# Patient Record
Sex: Female | Born: 1970 | Race: White | Hispanic: No | Marital: Married | Smoking: Current every day smoker
Health system: Southern US, Community
[De-identification: ages and names within clinical notes are randomized; demographics above are authoritative.]

## PROBLEM LIST (undated history)

## (undated) DIAGNOSIS — J45909 Unspecified asthma, uncomplicated: Secondary | ICD-10-CM

## (undated) DIAGNOSIS — N6001 Solitary cyst of right breast: Secondary | ICD-10-CM

## (undated) DIAGNOSIS — N3281 Overactive bladder: Secondary | ICD-10-CM

## (undated) DIAGNOSIS — Z975 Presence of (intrauterine) contraceptive device: Secondary | ICD-10-CM

## (undated) DIAGNOSIS — R062 Wheezing: Secondary | ICD-10-CM

## (undated) HISTORY — DX: Solitary cyst of right breast: N60.01

## (undated) HISTORY — DX: Wheezing: R06.2

## (undated) HISTORY — DX: Overactive bladder: N32.81

## (undated) HISTORY — DX: Presence of (intrauterine) contraceptive device: Z97.5

## (undated) HISTORY — DX: Unspecified asthma, uncomplicated: J45.909

---

## 2003-06-26 ENCOUNTER — Ambulatory Visit (HOSPITAL_COMMUNITY): Admission: RE | Admit: 2003-06-26 | Discharge: 2003-06-26 | Payer: Self-pay | Admitting: Family Medicine

## 2003-06-26 ENCOUNTER — Encounter: Payer: Self-pay | Admitting: Family Medicine

## 2005-08-01 ENCOUNTER — Ambulatory Visit (HOSPITAL_COMMUNITY): Admission: RE | Admit: 2005-08-01 | Discharge: 2005-08-01 | Payer: Self-pay | Admitting: Family Medicine

## 2008-06-12 ENCOUNTER — Other Ambulatory Visit: Admission: RE | Admit: 2008-06-12 | Discharge: 2008-06-12 | Payer: Self-pay | Admitting: Obstetrics & Gynecology

## 2009-12-15 ENCOUNTER — Other Ambulatory Visit: Admission: RE | Admit: 2009-12-15 | Discharge: 2009-12-15 | Payer: Self-pay | Admitting: Obstetrics and Gynecology

## 2009-12-25 ENCOUNTER — Ambulatory Visit (HOSPITAL_COMMUNITY): Admission: RE | Admit: 2009-12-25 | Discharge: 2009-12-25 | Payer: Self-pay | Admitting: Obstetrics & Gynecology

## 2010-12-30 ENCOUNTER — Other Ambulatory Visit (HOSPITAL_COMMUNITY): Payer: Self-pay | Admitting: Family Medicine

## 2010-12-30 DIAGNOSIS — Z139 Encounter for screening, unspecified: Secondary | ICD-10-CM

## 2011-01-04 ENCOUNTER — Ambulatory Visit (HOSPITAL_COMMUNITY)
Admission: RE | Admit: 2011-01-04 | Discharge: 2011-01-04 | Disposition: A | Discharge status: Home / Self Care | Payer: BC Managed Care – PPO | Source: Ambulatory Visit | Attending: Family Medicine | Admitting: Family Medicine

## 2011-01-04 DIAGNOSIS — Z139 Encounter for screening, unspecified: Secondary | ICD-10-CM

## 2011-01-04 DIAGNOSIS — Z1231 Encounter for screening mammogram for malignant neoplasm of breast: Secondary | ICD-10-CM | POA: Insufficient documentation

## 2011-12-13 ENCOUNTER — Other Ambulatory Visit (HOSPITAL_COMMUNITY)
Admission: RE | Admit: 2011-12-13 | Discharge: 2011-12-13 | Disposition: A | Discharge status: Home / Self Care | Payer: Managed Care, Other (non HMO) | Source: Ambulatory Visit | Attending: Obstetrics and Gynecology | Admitting: Obstetrics and Gynecology

## 2011-12-13 DIAGNOSIS — Z01419 Encounter for gynecological examination (general) (routine) without abnormal findings: Secondary | ICD-10-CM | POA: Insufficient documentation

## 2012-04-23 ENCOUNTER — Other Ambulatory Visit: Payer: Self-pay | Admitting: Adult Health

## 2012-04-23 ENCOUNTER — Ambulatory Visit (HOSPITAL_COMMUNITY)
Admission: RE | Admit: 2012-04-23 | Discharge: 2012-04-23 | Disposition: A | Discharge status: Home / Self Care | Payer: Managed Care, Other (non HMO) | Source: Ambulatory Visit | Attending: Adult Health | Admitting: Adult Health

## 2012-04-23 DIAGNOSIS — Z1231 Encounter for screening mammogram for malignant neoplasm of breast: Secondary | ICD-10-CM | POA: Insufficient documentation

## 2012-04-23 DIAGNOSIS — Z139 Encounter for screening, unspecified: Secondary | ICD-10-CM

## 2013-06-25 ENCOUNTER — Other Ambulatory Visit: Payer: Self-pay | Admitting: Adult Health

## 2013-06-25 DIAGNOSIS — Z139 Encounter for screening, unspecified: Secondary | ICD-10-CM

## 2013-06-27 ENCOUNTER — Ambulatory Visit (HOSPITAL_COMMUNITY)
Admission: RE | Admit: 2013-06-27 | Discharge: 2013-06-27 | Disposition: A | Discharge status: Home / Self Care | Payer: Managed Care, Other (non HMO) | Source: Ambulatory Visit | Attending: Adult Health | Admitting: Adult Health

## 2013-06-27 DIAGNOSIS — Z139 Encounter for screening, unspecified: Secondary | ICD-10-CM

## 2013-06-27 DIAGNOSIS — Z1231 Encounter for screening mammogram for malignant neoplasm of breast: Secondary | ICD-10-CM | POA: Insufficient documentation

## 2013-07-01 ENCOUNTER — Other Ambulatory Visit: Payer: Self-pay | Admitting: Adult Health

## 2013-07-01 ENCOUNTER — Telehealth: Payer: Self-pay | Admitting: Adult Health

## 2013-07-01 DIAGNOSIS — R928 Other abnormal and inconclusive findings on diagnostic imaging of breast: Secondary | ICD-10-CM

## 2013-07-01 NOTE — Telephone Encounter (Signed)
busy

## 2013-07-31 ENCOUNTER — Ambulatory Visit (HOSPITAL_COMMUNITY)
Admission: RE | Admit: 2013-07-31 | Discharge: 2013-07-31 | Disposition: A | Discharge status: Home / Self Care | Payer: Managed Care, Other (non HMO) | Source: Ambulatory Visit | Attending: Adult Health | Admitting: Adult Health

## 2013-07-31 DIAGNOSIS — R928 Other abnormal and inconclusive findings on diagnostic imaging of breast: Secondary | ICD-10-CM

## 2013-09-05 LAB — HM HIV SCREENING LAB: HM HIV Screening: NEGATIVE

## 2013-09-05 LAB — HM HEPATITIS C SCREENING LAB: HM Hepatitis Screen: NEGATIVE

## 2013-11-15 ENCOUNTER — Ambulatory Visit (INDEPENDENT_AMBULATORY_CARE_PROVIDER_SITE_OTHER): Payer: Managed Care, Other (non HMO) | Admitting: Adult Health

## 2013-11-15 ENCOUNTER — Encounter: Payer: Self-pay | Admitting: Adult Health

## 2013-11-15 ENCOUNTER — Ambulatory Visit (HOSPITAL_COMMUNITY)
Admission: RE | Admit: 2013-11-15 | Discharge: 2013-11-15 | Disposition: A | Discharge status: Home / Self Care | Payer: Managed Care, Other (non HMO) | Source: Ambulatory Visit | Attending: Adult Health | Admitting: Adult Health

## 2013-11-15 ENCOUNTER — Other Ambulatory Visit: Payer: Self-pay | Admitting: Adult Health

## 2013-11-15 ENCOUNTER — Telehealth: Payer: Self-pay | Admitting: Adult Health

## 2013-11-15 ENCOUNTER — Other Ambulatory Visit (HOSPITAL_COMMUNITY)
Admission: RE | Admit: 2013-11-15 | Discharge: 2013-11-15 | Disposition: A | Discharge status: Home / Self Care | Payer: Managed Care, Other (non HMO) | Source: Ambulatory Visit | Attending: Adult Health | Admitting: Adult Health

## 2013-11-15 ENCOUNTER — Encounter (INDEPENDENT_AMBULATORY_CARE_PROVIDER_SITE_OTHER): Payer: Self-pay

## 2013-11-15 VITALS — BP 120/70 | HR 72 | Ht 62.0 in | Wt 173.0 lb

## 2013-11-15 DIAGNOSIS — Z01419 Encounter for gynecological examination (general) (routine) without abnormal findings: Secondary | ICD-10-CM | POA: Insufficient documentation

## 2013-11-15 DIAGNOSIS — R062 Wheezing: Secondary | ICD-10-CM | POA: Insufficient documentation

## 2013-11-15 DIAGNOSIS — Z1151 Encounter for screening for human papillomavirus (HPV): Secondary | ICD-10-CM | POA: Insufficient documentation

## 2013-11-15 DIAGNOSIS — Z1212 Encounter for screening for malignant neoplasm of rectum: Secondary | ICD-10-CM

## 2013-11-15 HISTORY — DX: Wheezing: R06.2

## 2013-11-15 LAB — CBC
HCT: 43.1 % (ref 36.0–46.0)
HEMOGLOBIN: 15 g/dL (ref 12.0–15.0)
MCH: 30.3 pg (ref 26.0–34.0)
MCHC: 34.8 g/dL (ref 30.0–36.0)
MCV: 87.1 fL (ref 78.0–100.0)
PLATELETS: 280 10*3/uL (ref 150–400)
RBC: 4.95 MIL/uL (ref 3.87–5.11)
RDW: 13.8 % (ref 11.5–15.5)
WBC: 13 10*3/uL — ABNORMAL HIGH (ref 4.0–10.5)

## 2013-11-15 LAB — COMPREHENSIVE METABOLIC PANEL
ALBUMIN: 4.2 g/dL (ref 3.5–5.2)
ALT: 13 U/L (ref 0–35)
AST: 14 U/L (ref 0–37)
Alkaline Phosphatase: 48 U/L (ref 39–117)
BILIRUBIN TOTAL: 0.5 mg/dL (ref 0.2–1.2)
BUN: 9 mg/dL (ref 6–23)
CO2: 26 mEq/L (ref 19–32)
Calcium: 9.1 mg/dL (ref 8.4–10.5)
Chloride: 108 mEq/L (ref 96–112)
Creat: 0.65 mg/dL (ref 0.50–1.10)
GLUCOSE: 74 mg/dL (ref 70–99)
POTASSIUM: 4.9 meq/L (ref 3.5–5.3)
SODIUM: 138 meq/L (ref 135–145)
TOTAL PROTEIN: 6.6 g/dL (ref 6.0–8.3)

## 2013-11-15 LAB — HEMOCCULT GUIAC POC 1CARD (OFFICE): FECAL OCCULT BLD: NEGATIVE

## 2013-11-15 LAB — LIPID PANEL
CHOL/HDL RATIO: 3.7 ratio
CHOLESTEROL: 184 mg/dL (ref 0–200)
HDL: 50 mg/dL (ref >39)
LDL Cholesterol: 113 mg/dL — ABNORMAL HIGH (ref 0–99)
Triglycerides: 107 mg/dL (ref <150)
VLDL: 21 mg/dL (ref 0–40)

## 2013-11-15 NOTE — Progress Notes (Signed)
Patient ID: Bonnie Murphy, female   DOB: 12/03/1970, 43 y.o.   MRN: [id number] History of Present Illness: Bonnie Murphy is a 43 year old white female in for a pap and physical.  Current Medications, Allergies, Past Medical History, Past Surgical History, Family History and Social History were reviewed in [facility] electronic medical record.    Review of Systems: Patient denies any headaches, blurred vision, shortness of breath, chest pain, abdominal pain, problems with bowel movements, urination, or intercourse. No joint pain or mood swings.She has some wheezing at night and uses inhaler 2-3 x per week, and she smokes and is not ready to quit.Her dad died in November with lung cancer.   Physical Exam:BP 120/70  Pulse 72  Ht 5\' 2"  (1.575 m)  Wt 173 lb (78.472 kg)  BMI 31.63 kg/m2 General:  Well developed, well nourished, no acute distress Skin:  Warm and dry Neck:  Midline trachea, normal thyroid Lungs; Clear to auscultation bilaterally, except lower left has some wheezing Breast:  No dominant palpable mass, retraction, or nipple discharge Cardiovascular: Regular rate and rhythm Abdomen:  Soft, non tender, no hepatosplenomegaly Pelvic:  External genitalia is normal in appearance.  The vagina is normal in appearance.  The cervix is bulbous, +IUD string, pap with HPV performed.  Uterus is felt to be normal size, shape, and contour.  No adnexal masses or tenderness noted. Rectal: Good sphincter tone, no polyps, or hemorrhoids felt.  Hemoccult negative. Extremities:  No swelling or varicosities noted Psych:  No mood changes, alert and cooperative, seems happy  Impression: Yearly gyn exam Wheezing in smoker    Plan: Check CBC,CMP,TSH and lipids Get chest xray Return in June for removal and reinsertion of IUD Physical in 1 year Mammogram yearly

## 2013-11-15 NOTE — Patient Instructions (Signed)
Physical in 1 year Mammogram in 1 year Get chest xray Call for labs

## 2013-11-15 NOTE — Telephone Encounter (Signed)
Left message that chest xray was normal

## 2013-11-16 LAB — TSH: TSH: 1.295 u[IU]/mL (ref 0.350–4.500)

## 2013-11-18 ENCOUNTER — Telehealth: Payer: Self-pay | Admitting: Adult Health

## 2013-11-18 NOTE — Telephone Encounter (Signed)
Left message labs normal

## 2014-01-06 ENCOUNTER — Other Ambulatory Visit: Payer: Self-pay | Admitting: Adult Health

## 2014-01-06 DIAGNOSIS — N6009 Solitary cyst of unspecified breast: Secondary | ICD-10-CM

## 2014-01-06 DIAGNOSIS — Z09 Encounter for follow-up examination after completed treatment for conditions other than malignant neoplasm: Secondary | ICD-10-CM

## 2014-01-29 ENCOUNTER — Ambulatory Visit (HOSPITAL_COMMUNITY)
Admission: RE | Admit: 2014-01-29 | Discharge: 2014-01-29 | Disposition: A | Discharge status: Home / Self Care | Payer: Managed Care, Other (non HMO) | Source: Ambulatory Visit | Attending: Adult Health | Admitting: Adult Health

## 2014-01-29 ENCOUNTER — Other Ambulatory Visit: Payer: Self-pay | Admitting: Adult Health

## 2014-01-29 DIAGNOSIS — N6009 Solitary cyst of unspecified breast: Secondary | ICD-10-CM

## 2014-01-29 DIAGNOSIS — Z09 Encounter for follow-up examination after completed treatment for conditions other than malignant neoplasm: Secondary | ICD-10-CM

## 2014-02-14 ENCOUNTER — Ambulatory Visit: Payer: Managed Care, Other (non HMO) | Admitting: Adult Health

## 2014-02-18 ENCOUNTER — Encounter: Payer: Self-pay | Admitting: Advanced Practice Midwife

## 2014-02-18 ENCOUNTER — Ambulatory Visit (INDEPENDENT_AMBULATORY_CARE_PROVIDER_SITE_OTHER): Payer: Managed Care, Other (non HMO) | Admitting: Advanced Practice Midwife

## 2014-02-18 VITALS — BP 110/70 | Ht 62.0 in | Wt 169.0 lb

## 2014-02-18 DIAGNOSIS — Z30433 Encounter for removal and reinsertion of intrauterine contraceptive device: Secondary | ICD-10-CM

## 2014-02-18 DIAGNOSIS — Z3202 Encounter for pregnancy test, result negative: Secondary | ICD-10-CM

## 2014-02-18 DIAGNOSIS — Z30430 Encounter for insertion of intrauterine contraceptive device: Secondary | ICD-10-CM

## 2014-02-18 LAB — POCT URINE PREGNANCY: Preg Test, Ur: NEGATIVE

## 2014-02-18 NOTE — Progress Notes (Signed)
Bonnie Murphy    HPI:   43 y.o. year old Caucasian female   who presents for removal and replacement of a Mirena IUD.  It has been 5 years since her previous IUD placement.   Filed Vitals:   02/18/14 1520  BP: 110/70    The risks and benefits of the method and placement have been thouroughly reviewed with the patient and all questions were answered.  Specifically the patient is aware of failure rate of 09/998, expulsion of the IUD and of possible perforation.  The patient is aware of irregular bleeding due to the method and understands the incidence of irregular bleeding diminishes with time.  Time out was performed.  A Graves speculum was placed.  The cervix was prepped using Betadine. The strings were found to be  visible.   They were grasped and the Mirena was easily removed. The cervix was then grasped with a tenaculum and the uterus was sounded to 7 cm. The IUD was inserted to 7 cm.  It was pulled back 1 cm and the IUD was disengaged.  The strings were trimmed to 3 cm.  Sonogram was performed and the proper placement of the IUD was verified.  The patient was instructed on signs and symptoms of infection and to check for the strings after each menses or each month.  The patient is to refrain from intercourse for 3 days.

## 2014-03-27 ENCOUNTER — Ambulatory Visit: Payer: Managed Care, Other (non HMO) | Admitting: Advanced Practice Midwife

## 2014-07-07 ENCOUNTER — Encounter: Payer: Self-pay | Admitting: Advanced Practice Midwife

## 2014-07-15 ENCOUNTER — Other Ambulatory Visit: Payer: Self-pay | Admitting: Adult Health

## 2014-07-15 DIAGNOSIS — Z1231 Encounter for screening mammogram for malignant neoplasm of breast: Secondary | ICD-10-CM

## 2014-07-18 ENCOUNTER — Ambulatory Visit (HOSPITAL_COMMUNITY)
Admission: RE | Admit: 2014-07-18 | Discharge: 2014-07-18 | Disposition: A | Discharge status: Home / Self Care | Payer: Managed Care, Other (non HMO) | Source: Ambulatory Visit | Attending: Adult Health | Admitting: Adult Health

## 2014-07-18 DIAGNOSIS — Z1231 Encounter for screening mammogram for malignant neoplasm of breast: Secondary | ICD-10-CM

## 2014-11-14 ENCOUNTER — Encounter (INDEPENDENT_AMBULATORY_CARE_PROVIDER_SITE_OTHER): Payer: Self-pay

## 2014-11-14 ENCOUNTER — Telehealth: Payer: Self-pay | Admitting: Nurse Practitioner

## 2014-11-14 ENCOUNTER — Encounter: Payer: Self-pay | Admitting: Nurse Practitioner

## 2014-11-14 ENCOUNTER — Ambulatory Visit (INDEPENDENT_AMBULATORY_CARE_PROVIDER_SITE_OTHER): Payer: Managed Care, Other (non HMO) | Admitting: Nurse Practitioner

## 2014-11-14 VITALS — BP 120/82 | HR 60 | Temp 98.2°F | Resp 12 | Ht 63.0 in | Wt 172.8 lb

## 2014-11-14 DIAGNOSIS — Z720 Tobacco use: Secondary | ICD-10-CM

## 2014-11-14 DIAGNOSIS — Z418 Encounter for other procedures for purposes other than remedying health state: Secondary | ICD-10-CM

## 2014-11-14 DIAGNOSIS — Z299 Encounter for prophylactic measures, unspecified: Secondary | ICD-10-CM

## 2014-11-14 DIAGNOSIS — Z7689 Persons encountering health services in other specified circumstances: Secondary | ICD-10-CM

## 2014-11-14 DIAGNOSIS — Z23 Encounter for immunization: Secondary | ICD-10-CM

## 2014-11-14 DIAGNOSIS — F17200 Nicotine dependence, unspecified, uncomplicated: Secondary | ICD-10-CM

## 2014-11-14 DIAGNOSIS — Z7189 Other specified counseling: Secondary | ICD-10-CM

## 2014-11-14 NOTE — Patient Instructions (Signed)
Welcome to [facility]!    Smoking Cessation Quitting smoking is important to your health and has many advantages. However, it is not always easy to quit since nicotine is a very addictive drug. Oftentimes, people try 3 times or more before being able to quit. This document explains the best ways for you to prepare to quit smoking. Quitting takes hard work and a lot of effort, but you can do it. ADVANTAGES OF QUITTING SMOKING  You will live longer, feel better, and live better.  Your body will feel the impact of quitting smoking almost immediately.  Within 20 minutes, blood pressure decreases. Your pulse returns to its normal level.  After 8 hours, carbon monoxide levels in the blood return to normal. Your oxygen level increases.  After 24 hours, the chance of having a heart attack starts to decrease. Your breath, hair, and body stop smelling like smoke.  After 48 hours, damaged nerve endings begin to recover. Your sense of taste and smell improve.  After 72 hours, the body is virtually free of nicotine. Your bronchial tubes relax and breathing becomes easier.  After 2 to 12 weeks, lungs can hold more air. Exercise becomes easier and circulation improves.  The risk of having a heart attack, stroke, cancer, or lung disease is greatly reduced.  After 1 year, the risk of coronary heart disease is cut in half.  After 5 years, the risk of stroke falls to the same as a nonsmoker.  After 10 years, the risk of lung cancer is cut in half and the risk of other cancers decreases significantly.  After 15 years, the risk of coronary heart disease drops, usually to the level of a nonsmoker.  If you are pregnant, quitting smoking will improve your chances of having a healthy baby.  The people you live with, especially any children, will be healthier.  You will have extra money to spend on things other than cigarettes. QUESTIONS TO THINK ABOUT BEFORE ATTEMPTING TO QUIT You may want to talk about  your answers with your health care provider.  Why do you want to quit?  If you tried to quit in the past, what helped and what did not?  What will be the most difficult situations for you after you quit? How will you plan to handle them?  Who can help you through the tough times? Your family? Friends? A health care provider?  What pleasures do you get from smoking? What ways can you still get pleasure if you quit? Here are some questions to ask your health care provider:  How can you help me to be successful at quitting?  What medicine do you think would be best for me and how should I take it?  What should I do if I need more help?  What is smoking withdrawal like? How can I get information on withdrawal? GET READY  Set a quit date.  Change your environment by getting rid of all cigarettes, ashtrays, matches, and lighters in your home, car, or work. Do not let people smoke in your home.  Review your past attempts to quit. Think about what worked and what did not. GET SUPPORT AND ENCOURAGEMENT You have a better chance of being successful if you have help. You can get support in many ways.  Tell your family, friends, and coworkers that you are going to quit and need their support. Ask them not to smoke around you.  Get individual, group, or telephone counseling and support. Programs are available at  local hospitals and health centers. Call your local health department for information about programs in your area.  Spiritual beliefs and practices may help some smokers quit.  Download a "quit meter" on your computer to keep track of quit statistics, such as how long you have gone without smoking, cigarettes not smoked, and money saved.  Get a self-help book about quitting smoking and staying off tobacco. LEARN NEW SKILLS AND BEHAVIORS  Distract yourself from urges to smoke. Talk to someone, go for a walk, or occupy your time with a task.  Change your normal routine. Take a  different route to work. Drink tea instead of coffee. Eat breakfast in a different place.  Reduce your stress. Take a hot bath, exercise, or read a book.  Plan something enjoyable to do every day. Reward yourself for not smoking.  Explore interactive web-based programs that specialize in helping you quit. GET MEDICINE AND USE IT CORRECTLY Medicines can help you stop smoking and decrease the urge to smoke. Combining medicine with the above behavioral methods and support can greatly increase your chances of successfully quitting smoking.  Nicotine replacement therapy helps deliver nicotine to your body without the negative effects and risks of smoking. Nicotine replacement therapy includes nicotine gum, lozenges, inhalers, nasal sprays, and skin patches. Some may be available over-the-counter and others require a prescription.  Antidepressant medicine helps people abstain from smoking, but how this works is unknown. This medicine is available by prescription.  Nicotinic receptor partial agonist medicine simulates the effect of nicotine in your brain. This medicine is available by prescription. Ask your health care provider for advice about which medicines to use and how to use them based on your health history. Your health care provider will tell you what side effects to look out for if you choose to be on a medicine or therapy. Carefully read the information on the package. Do not use any other product containing nicotine while using a nicotine replacement product.  RELAPSE OR DIFFICULT SITUATIONS Most relapses occur within the first 3 months after quitting. Do not be discouraged if you start smoking again. Remember, most people try several times before finally quitting. You may have symptoms of withdrawal because your body is used to nicotine. You may crave cigarettes, be irritable, feel very hungry, cough often, get headaches, or have difficulty concentrating. The withdrawal symptoms are only  temporary. They are strongest when you first quit, but they will go away within 10-14 days. To reduce the chances of relapse, try to:  Avoid drinking alcohol. Drinking lowers your chances of successfully quitting.  Reduce the amount of caffeine you consume. Once you quit smoking, the amount of caffeine in your body increases and can give you symptoms, such as a rapid heartbeat, sweating, and anxiety.  Avoid smokers because they can make you want to smoke.  Do not let weight gain distract you. Many smokers will gain weight when they quit, usually less than 10 pounds. Eat a healthy diet and stay active. You can always lose the weight gained after you quit.  Find ways to improve your mood other than smoking. FOR MORE INFORMATION  www.smokefree.gov  Document Released: 08/16/2001 Document Revised: 01/06/2014 Document Reviewed: 12/01/2011 [facility] Patient Information 2015 [employer] [place]. This information is not intended to replace advice given to you by your health care provider. Make sure you discuss any questions you have with your health care provider.

## 2014-11-14 NOTE — Progress Notes (Signed)
Pre visit review using our clinic review tool, if applicable. No additional management support is needed unless otherwise documented below in the visit note. 

## 2014-11-14 NOTE — Progress Notes (Signed)
Subjective:    Patient ID: Bonnie Murphy, female    DOB: 1971-02-10, 44 y.o.   MRN: [id number]  HPI  Ms. Chambers is a 44 yo female establishing care today.   1) Health Maintenance-   Diet- Eats at home mostly   Exercise- Works 10 hours 4 days a week, active job  Immunizations-  Refuses flu, Today will get a tdap   Mammogram- 06/2014 normal   Pap- 11/2013 [facility] normal, going next Friday.   Eye Exam- Last year  Dental Exam- UTD   2) Chronic Problems-  Asthma- Mild persistent, worse around season changes, Zyrtec- as needed, inhaler helpful   Cyst of right breast- Ultrasound twice, benign   3) Acute Problems-  No refills needed or acute issues.   Tobacco Use- nicotine losanges- throat scratchy. Interested in quitting    Review of Systems  Constitutional: Negative for fever, chills, diaphoresis and fatigue.  HENT: Negative for tinnitus and trouble swallowing.   Eyes: Negative for visual disturbance.  Respiratory: Positive for wheezing. Negative for chest tightness and shortness of breath.        Intermittent- controlled with inhaler  Cardiovascular: Negative for chest pain, palpitations and leg swelling.  Gastrointestinal: Negative for nausea, vomiting, diarrhea and constipation.  Genitourinary: Negative for dysuria.  Musculoskeletal: Negative for back pain and neck pain.  Skin: Negative for rash.  Neurological: Negative for dizziness, weakness, numbness and headaches.  Psychiatric/Behavioral: Negative for suicidal ideas and sleep disturbance. The patient is not nervous/anxious.    Past Medical History  Diagnosis Date  . Asthma     seasonal  . Cyst of right breast   . Wheezing November 17, 2013    Smokes, dad died with lung cancer get chest xray    History   Social History  . Marital Status: Married    Spouse Name: N/A  . Number of Children: N/A  . Years of Education: N/A   Occupational History  . Not on file.   Social History Main Topics  . Smoking status:  Current Every Day Smoker -- 1.00 packs/day    Types: Cigarettes  . Smokeless tobacco: Never Used     Comment: e -cig  . Alcohol Use: 0.6 oz/week    1 Standard drinks or equivalent per week     Comment: socially  . Drug Use: No  . Sexual Activity:    Partners: Male    Birth Control/ Protection: IUD     Comment: Husband    Other Topics Concern  . Not on file   Social History Narrative   [employer] in [place], television/film set    Lives with husband and 1 son   1 son has 1 son of his own 8 months    1 dog lives outside   Enjoys [place], outside activities, gardening    Past Surgical History  Procedure Laterality Date  . Cesarean section      Family History  Problem Relation Age of Onset  . Diabetes Mother   . Cancer Father     lung  . Cirrhosis Father   . Cancer Paternal Aunt     lung  . Cancer Paternal Uncle     lung  . Cirrhosis Maternal Grandmother     No Known Allergies  Current Outpatient Prescriptions on File Prior to Visit  Medication Sig Dispense Refill  . ALBUTEROL IN Inhale 2 puffs into the lungs as needed.    Marland Kitchen levonorgestrel (MIRENA) 20 MCG/24HR IUD 1 each by Intrauterine route  once.     No current facility-administered medications on file prior to visit.      Objective:   Physical Exam  Constitutional: She is oriented to person, place, and time. She appears well-developed and well-nourished. No distress.  BP 120/82 mmHg  Pulse 60  Temp(Src) 98.2 F (36.8 C) (Oral)  Resp 12  Ht 5\' 3"  (1.6 m)  Wt 172 lb 12.8 oz (78.382 kg)  BMI 30.62 kg/m2  SpO2 95%   HENT:  Head: Normocephalic and atraumatic.  Right Ear: External ear normal.  Left Ear: External ear normal.  Eyes: Right eye exhibits no discharge. Left eye exhibits no discharge. No scleral icterus.  Neck: Normal range of motion. Neck supple. No thyromegaly present.  Cardiovascular: Normal rate, regular rhythm, normal heart sounds and intact distal pulses.  Exam reveals no gallop and no  friction rub.   No murmur heard. Pulmonary/Chest: Effort normal and breath sounds normal. No respiratory distress. She has no wheezes. She has no rales. She exhibits no tenderness.  Abdominal: Soft. Bowel sounds are normal. She exhibits no distension and no mass. There is no tenderness. There is no rebound and no guarding.  Musculoskeletal: Normal range of motion. She exhibits no edema or tenderness.  Lymphadenopathy:    She has no cervical adenopathy.  Neurological: She is alert and oriented to person, place, and time. No cranial nerve deficit. She exhibits normal muscle tone. Coordination normal.  Skin: Skin is warm and dry. No rash noted. She is not diaphoretic.  Psychiatric: She has a normal mood and affect. Her behavior is normal. Judgment and thought content normal.      Assessment & Plan:

## 2014-11-14 NOTE — Telephone Encounter (Signed)
emmi mailed  °

## 2014-11-17 DIAGNOSIS — Z7689 Persons encountering health services in other specified circumstances: Secondary | ICD-10-CM | POA: Insufficient documentation

## 2014-11-17 DIAGNOSIS — F17200 Nicotine dependence, unspecified, uncomplicated: Secondary | ICD-10-CM | POA: Insufficient documentation

## 2014-11-17 NOTE — Assessment & Plan Note (Signed)
Discussed acute and chronic issues. Reviewed health maintenance measures, PFSHx, and immunizations. Will receive Tdap today, refuse flu vaccine.

## 2014-11-17 NOTE — Assessment & Plan Note (Signed)
Pt interested in quitting, tried lozenges and willing to try other methods. Gave handout with AVS for pt to read, highlighted questions to think about. FU prn worsening/failure to improve.

## 2014-11-21 ENCOUNTER — Encounter: Payer: Self-pay | Admitting: Adult Health

## 2014-11-21 ENCOUNTER — Ambulatory Visit (INDEPENDENT_AMBULATORY_CARE_PROVIDER_SITE_OTHER): Payer: Managed Care, Other (non HMO) | Admitting: Adult Health

## 2014-11-21 VITALS — BP 124/78 | HR 63 | Ht 62.25 in | Wt 172.5 lb

## 2014-11-21 DIAGNOSIS — Z975 Presence of (intrauterine) contraceptive device: Secondary | ICD-10-CM

## 2014-11-21 DIAGNOSIS — Z1212 Encounter for screening for malignant neoplasm of rectum: Secondary | ICD-10-CM | POA: Diagnosis not present

## 2014-11-21 DIAGNOSIS — R062 Wheezing: Secondary | ICD-10-CM

## 2014-11-21 DIAGNOSIS — Z01419 Encounter for gynecological examination (general) (routine) without abnormal findings: Secondary | ICD-10-CM

## 2014-11-21 DIAGNOSIS — N3281 Overactive bladder: Secondary | ICD-10-CM

## 2014-11-21 HISTORY — DX: Overactive bladder: N32.81

## 2014-11-21 HISTORY — DX: Presence of (intrauterine) contraceptive device: Z97.5

## 2014-11-21 LAB — HEMOCCULT GUIAC POC 1CARD (OFFICE): Fecal Occult Blood, POC: NEGATIVE

## 2014-11-21 MED ORDER — MIRABEGRON ER 25 MG PO TB24: 25.0000 mg | ORAL_TABLET | Freq: Every day | ORAL | Status: DC

## 2014-11-21 NOTE — Progress Notes (Signed)
Patient ID: Bonnie Murphy, female   DOB: 02/22/1971, 44 y.o.   MRN: [id number] History of Present Illness: Bonnie Murphy is a 44 year old white female in for well woman gyn exam.She had a normal pap with negative HPV 11/15/13.   Current Medications, Allergies, Past Medical History, Past Surgical History, Family History and Social History were reviewed in [facility] electronic medical record.     Review of Systems: Patient denies any headaches, hearing loss, fatigue, blurred vision, shortness of breath, chest pain, abdominal pain, problems with bowel movements, or intercourse. No joint pain or mood swings. Has to pee every 15 minutes, has some congestion is using inhaler.Has new PCP [facility] [place] [facility]14/14.   Physical Exam:BP 124/78 mmHg  Pulse 63  Ht 5' 2.25" (1.581 m)  Wt 172 lb 8 oz (78.245 kg)  BMI 31.30 kg/m2General:  Well developed, well nourished, no acute distress Skin:  Warm and dry Neck:  Midline trachea, normal thyroid, good ROM, no lymphadenopathy Lungs; Wheezing  bilaterally Breast:  No dominant palpable mass, retraction, or nipple discharge Cardiovascular: Regular rate and rhythm Abdomen:  Soft, non tender, no hepatosplenomegaly Pelvic:  External genitalia is normal in appearance, no lesions.  The vagina is normal in appearance,with good color, moisture and rugae. Urethra has no lesions or masses. The cervix is bulbous.+IUD strings.  Uterus is felt to be normal size, shape, and contour.  No adnexal masses or tenderness noted.Bladder is non tender, no masses felt. Rectal: Good sphincter tone, no polyps, or hemorrhoids felt.  Hemoccult negative. Extremities/musculoskeletal:  No swelling or varicosities noted, no clubbing or cyanosis Psych:  No mood changes, alert and cooperative,seems happy   Impression: Well woman gyn exam no pap IUD in place Wheezing OAB   Plan: Check CBC,CMP,TSH and lipids Rx myrbetriq 25 mg #30 1 daily with 6 refills,30 day  free card given,review handout on OAB and decrease caffeine Physical in 1 year Mammogram yearly

## 2014-11-21 NOTE — Patient Instructions (Signed)
Overactive Bladder The bladder has two functions that are totally opposite of the other. One is to relax and stretch out so it can store urine (fills like a balloon), and the other is to contract and squeeze down so that it can empty the urine that it has stored. Proper functioning of the bladder is a complex mixing of these two functions. The filling and emptying of the bladder can be influenced by:  The bladder.  The spinal cord.  The brain.  The nerves going to the bladder.  Other organs that are closely related to the bladder such as prostate in males and the vagina in females. As your bladder fills with urine, nerve signals are sent from the bladder to the brain to tell you that you may need to urinate. Normal urination requires that the bladder squeeze down with sufficient strength to empty the bladder, but this also requires that the bladder squeeze down sufficiently long to finish the job. In addition the sphincter muscles, which normally keep you from leaking urine, must also relax so that the urine can pass. Coordination between the bladder muscle squeezing down and the sphincter muscles relaxing is required to make everything happen normally. With an overactive bladder sometimes the muscles of the bladder contract unexpectedly and involuntarily and this causes an urgent need to urinate. The normal response is to try to hold urine in by contracting the sphincter muscles. Sometimes the bladder contracts so strongly that the sphincter muscles cannot stop the urine from passing out and incontinence occurs. This kind of incontinence is called urge incontinence. Having an overactive bladder can be embarrassing and awkward. It can keep you from living life the way you want to. Many people think it is just something you have to put up with as you grow older or have certain health conditions. In fact, there are treatments that can help make your life easier and more pleasant. CAUSES  Many things  can cause an overactive bladder. Possibilities include:  Urinary tract infection or infection of nearby tissues such as the prostate.  Prostate enlargement.  In women, multiple pregnancies or surgery on the uterus or urethra.  Bladder stones, inflammation, or tumors.  Caffeine.  Alcohol.  Medications. For example, diuretics (drugs that help the body get rid of extra fluid) increase urine production. Some other medicines must be taken with lots of fluids.  Muscle or nerve weakness. This might be the result of a spinal cord injury, a stroke, multiple sclerosis, or Parkinson disease.  Diabetes can cause a high urine volume which fills the bladder so quickly that the normal urge to urinate is triggered very strongly. SYMPTOMS   Loss of bladder control. You feel the need to urinate and cannot make your body wait.  Sudden, strong urges to urinate.  Urinating 8 or more times a day.  Waking up to urinate two or more times a night. DIAGNOSIS  To decide if you have overactive bladder, your health care provider will probably:  Ask about symptoms you have noticed.  Ask about your overall health. This will include questions about any medications you are taking.  Do a physical examination. This will help determine if there are obvious blockages or other problems.  Order some tests. These might include:  A blood test to check for diabetes or other health issues that could be contributing to the problem.  Urine testing. This could measure the flow of urine and the pressure on the bladder.  A test of your neurological   system (the brain, spinal cord, and nerves). This is the system that senses the need to urinate. Some of these tests are called flow tests, bladder pressure tests, and electrical measurements of the sphincter muscle.  A bladder test to check whether it is emptying completely when you urinate.  Cystoscopy. This test uses a thin tube with a tiny camera on it. It offers a  look inside your urethra and bladder to see if there are problems.  Imaging tests. You might be given a contrast dye and then asked to urinate. X-rays are taken to see how your bladder is working. TREATMENT  An overactive bladder can be treated in many ways. The treatment will depend on the cause. Whether you have a mild or severe case also makes a difference. Often, treatment can be given in your health care provider's office or clinic. Be sure to discuss the different options with your caregiver. They include:  Behavioral treatments. These do not involve medication or surgery:  Bladder training. For this, you would follow a schedule to urinate at regular intervals. This helps you learn to control the urge to urinate. At first, you might be asked to wait a few minutes after feeling the urge. In time, you should be able to schedule bathroom visits an hour or more apart.  Kegel exercises. These exercises strengthen the pelvic floor muscles, which support the bladder. Toning these muscles can help control urination even if the bladder muscles are overactive. A specialist will teach you how to do these exercises correctly. They will require daily practice.  Weight loss. If you are obese or overweight, losing weight might stop your bladder from being overactive. Talk to your health care provider about how many pounds you should lose. Also ask if there is a specific program or method that would work best for you.  Diet change. This might be suggested if constipation is making your overactive bladder worse. Your health care provider or a nutritionist can explain ways to change what you eat to ease constipation. Other people might need to take in less caffeine or alcohol. Sometimes drinking fewer fluids is needed, too.  Protection. This is not an actual treatment. But, you could wear special pads to take care of any leakage while you wait for other treatments to take effect. This will help you avoid  embarrassment.  Physical treatments.  Electrical stimulation. Electrodes will send gentle pulses to the nerves or muscles that help control the bladder. The goal is to strengthen them. Sometimes this is done with the electrodes outside the body. Or, they might be placed inside the body (implanted). This treatment can take several months to have an effect.  Medications. These are usually used along with other treatments. Several medicines are available. Some are injected into the muscles involved in urination. Others come in pill form. Medications sometimes prescribed include:  Anticholinergics. These drugs block the signals that the nerves deliver to the bladder. This keeps it from releasing urine at the wrong time. Researchers think the drugs might help in other ways, too.  Imipramine. This is an antidepressant. But, it relaxes bladder muscles.  Botox. This is still experimental. Some people believe that injecting it into the bladder muscles will relax them so they work more normally. It has also been injected into the sphincter muscle when the sphincter muscle does not open properly. This is a temporary fix, however. Also, it might make matters worse, especially in older people.  Surgery.  A device might be implanted   to help manage your nerves. It works on the nerves that signal when you need to urinate.  Surgery is sometimes needed with electrical stimulation. If the electrodes are implanted, this is done through surgery.  Sometimes repairs need to be made through surgery. For example, the size of the bladder can be changed. This is usually done in severe cases only. HOME CARE INSTRUCTIONS   Take any medications your health care provider prescribed or suggested. Follow the directions carefully.  Practice any lifestyle changes that are recommended. These might include:  physiclain 1 year  Drinking less fluid or drinking at different times of the day. If you need to urinate often during  the night, for example, you may need to stop drinking fluids early in the evening.  Cutting down on caffeine or alcohol. They can both make an overactive bladder worse. Caffeine is found in coffee, tea, and sodas.  Doing Kegel exercises to strengthen muscles.  Losing weight, if that is recommended.  Eating a healthy and balanced diet. This will help you avoid constipation.  Keep a journal or a log. You might be asked to record how much you drink and when, and also when you feel the need to urinate.  Learn how to care for implants or other devices, such as pessaries. SEEK MEDICAL CARE IF:   Your overactive bladder gets worse.  You feel increased pain or irritation when you urinate.  You notice blood in your urine.  You have questions about any medications or devices that your health care provider recommended.  You notice blood, pus, or swelling at the site of any test or treatment procedure.  You have an oral temperature above 102F (38.9C). SEEK IMMEDIATE MEDICAL CARE IF:  You have an oral temperature above 102F (38.9C), not controlled by medicine. Document Released: 06/18/2009 Document Revised: 01/06/2014 Document Reviewed: 06/18/2009 physical in 1 year [facility] Patient Information 2015 [employer] [place]. This information is not intended to replace advice given to you by your health care provider. Make sure you discuss any questions you have with your health care provider.

## 2014-11-22 LAB — LIPID PANEL
Chol/HDL Ratio: 4.2 ratio units (ref 0.0–4.4)
Cholesterol, Total: 180 mg/dL (ref 100–199)
HDL: 43 mg/dL (ref >39)
LDL Calculated: 116 mg/dL — ABNORMAL HIGH (ref 0–99)
TRIGLYCERIDES: 105 mg/dL (ref 0–149)
VLDL CHOLESTEROL CAL: 21 mg/dL (ref 5–40)

## 2014-11-22 LAB — COMPREHENSIVE METABOLIC PANEL
A/G RATIO: 1.8 (ref 1.1–2.5)
ALK PHOS: 53 IU/L (ref 39–117)
ALT: 13 IU/L (ref 0–32)
AST: 18 IU/L (ref 0–40)
Albumin: 4 g/dL (ref 3.5–5.5)
BUN/Creatinine Ratio: 13 (ref 9–23)
BUN: 8 mg/dL (ref 6–24)
Bilirubin Total: 0.4 mg/dL (ref 0.0–1.2)
CHLORIDE: 104 mmol/L (ref 97–108)
CO2: 22 mmol/L (ref 18–29)
Calcium: 8.8 mg/dL (ref 8.7–10.2)
Creatinine, Ser: 0.62 mg/dL (ref 0.57–1.00)
GFR, EST AFRICAN AMERICAN: 126 mL/min/{1.73_m2} (ref >59)
GFR, EST NON AFRICAN AMERICAN: 109 mL/min/{1.73_m2} (ref >59)
GLOBULIN, TOTAL: 2.2 g/dL (ref 1.5–4.5)
Glucose: 79 mg/dL (ref 65–99)
Potassium: 4.4 mmol/L (ref 3.5–5.2)
SODIUM: 140 mmol/L (ref 134–144)
TOTAL PROTEIN: 6.2 g/dL (ref 6.0–8.5)

## 2014-11-22 LAB — CBC
HEMATOCRIT: 42.7 % (ref 34.0–46.6)
Hemoglobin: 14.6 g/dL (ref 11.1–15.9)
MCH: 30 pg (ref 26.6–33.0)
MCHC: 34.2 g/dL (ref 31.5–35.7)
MCV: 88 fL (ref 79–97)
Platelets: 251 10*3/uL (ref 150–379)
RBC: 4.87 x10E6/uL (ref 3.77–5.28)
RDW: 13.6 % (ref 12.3–15.4)
WBC: 8 10*3/uL (ref 3.4–10.8)

## 2014-11-22 LAB — TSH: TSH: 1.54 u[IU]/mL (ref 0.450–4.500)

## 2014-11-24 ENCOUNTER — Telehealth: Payer: Self-pay | Admitting: Adult Health

## 2014-11-24 NOTE — Telephone Encounter (Signed)
Left message QHCG 2 which is good was 730 11/13/14, keep 4/8 appt call if needed 

## 2015-05-13 ENCOUNTER — Ambulatory Visit: Payer: Managed Care, Other (non HMO) | Admitting: Nurse Practitioner

## 2015-06-05 ENCOUNTER — Ambulatory Visit (INDEPENDENT_AMBULATORY_CARE_PROVIDER_SITE_OTHER): Payer: Managed Care, Other (non HMO) | Admitting: Nurse Practitioner

## 2015-06-05 ENCOUNTER — Encounter: Payer: Self-pay | Admitting: Nurse Practitioner

## 2015-06-05 VITALS — BP 110/80 | HR 66 | Temp 98.8°F | Resp 14 | Ht 62.0 in | Wt 175.6 lb

## 2015-06-05 DIAGNOSIS — Z720 Tobacco use: Secondary | ICD-10-CM | POA: Diagnosis not present

## 2015-06-05 DIAGNOSIS — E669 Obesity, unspecified: Secondary | ICD-10-CM | POA: Diagnosis not present

## 2015-06-05 DIAGNOSIS — R062 Wheezing: Secondary | ICD-10-CM | POA: Diagnosis not present

## 2015-06-05 DIAGNOSIS — Z23 Encounter for immunization: Secondary | ICD-10-CM

## 2015-06-05 DIAGNOSIS — F17200 Nicotine dependence, unspecified, uncomplicated: Secondary | ICD-10-CM

## 2015-06-05 MED ORDER — ALBUTEROL SULFATE HFA 108 (90 BASE) MCG/ACT IN AERS: 2.0000 | INHALATION_SPRAY | Freq: Four times a day (QID) | RESPIRATORY_TRACT | Status: DC | PRN

## 2015-06-05 NOTE — Assessment & Plan Note (Signed)
Wt Readings from Last 3 Encounters:  06/05/15 175 lb 9.6 oz (79.652 kg)  11/21/14 172 lb 8 oz (78.245 kg)  11/14/14 172 lb 12.8 oz (78.382 kg)   Pt is up 3 lbs today. No formal diet or exercise at this time. Asked her to work on a healthy low carb diet for 1 month and add exercise. Write down 7-10 days of food, fluids, and exercise in order to evaluate and next visit.

## 2015-06-05 NOTE — Assessment & Plan Note (Signed)
Not ready to quit smoking. Discussed options, she understands the need to quit smoking

## 2015-06-05 NOTE — Progress Notes (Signed)
Patient ID: Bonnie Murphy, female    DOB: 11/10/1970  Age: 44 y.o. MRN: [id number]  CC: Medication Refill   HPI Bonnie Murphy presents for follow up medication refill and CC of weight loss.   1) Flu vaccine today.   2) Weight- Melrose Nakayama, Green tea diet pills, phentermine-helpful in past. Not tried any programs, feels she craves sweets   Wt Readings from Last 3 Encounters:  06/05/15 175 lb 9.6 oz (79.652 kg)  11/21/14 172 lb 8 oz (78.245 kg)  11/14/14 172 lb 12.8 oz (78.382 kg)   3) Tobacco use- tried lozenges, patches    History Bonnie Murphy has a past medical history of Asthma; Cyst of right breast; Wheezing (11/15/2013); IUD (intrauterine device) in place (11/21/2014); and OAB (overactive bladder) (11/21/2014).   She has past surgical history that includes Cesarean section.   Her family history includes Cancer in her father, paternal aunt, and paternal uncle; Cirrhosis in her father and paternal grandmother; Diabetes in her mother.She reports that she has been smoking Cigarettes.  She has a 15 pack-year smoking history. She has never used smokeless tobacco. She reports that she drinks about 0.6 oz of alcohol per week. She reports that she does not use illicit drugs.  Outpatient Prescriptions Prior to Visit  Medication Sig Dispense Refill  . levonorgestrel (MIRENA) 20 MCG/24HR IUD 1 each by Intrauterine route once.    . ALBUTEROL IN Inhale 2 puffs into the lungs as needed.    . mirabegron ER (MYRBETRIQ) 25 MG TB24 tablet Take 1 tablet (25 mg total) by mouth daily. (Patient not taking: Reported on 06/05/2015) 30 tablet 6   No facility-administered medications prior to visit.    ROS Review of Systems  Constitutional: Negative for fever, chills, diaphoresis and fatigue.  Respiratory: Positive for wheezing. Negative for chest tightness and shortness of breath.   Cardiovascular: Negative for chest pain, palpitations and leg swelling.  Gastrointestinal: Negative for nausea, vomiting  and diarrhea.  Skin: Negative for rash.  Neurological: Negative for dizziness, weakness, numbness and headaches.  Psychiatric/Behavioral: The patient is not nervous/anxious.     Objective:  BP 110/80 mmHg  Pulse 66  Temp(Src) 98.8 F (37.1 C)  Resp 14  Ht  (1.575 m)  Wt 175 lb 9.6 oz (79.652 kg)  BMI 32.11 kg/m2  SpO2 97%  Physical Exam  Constitutional: She is oriented to person, place, and time. She appears well-developed and well-nourished. No distress.  HENT:  Head: Normocephalic and atraumatic.  Right Ear: External ear normal.  Left Ear: External ear normal.  Cardiovascular: Normal rate, regular rhythm and normal heart sounds.   Pulmonary/Chest: Effort normal and breath sounds normal. No respiratory distress. She has no wheezes. She has no rales. She exhibits no tenderness.  Abdominal:  Obese  Neurological: She is alert and oriented to person, place, and time. No cranial nerve deficit. She exhibits normal muscle tone. Coordination normal.  Skin: Skin is warm and dry. No rash noted. She is not diaphoretic.  Psychiatric: She has a normal mood and affect. Her behavior is normal. Judgment and thought content normal.   Assessment & Plan:   Bonnie Murphy was seen today for medication refill.  Diagnoses and all orders for this visit:  Encounter for immunization  Wheezing  Tobacco use disorder  Obese  Other orders -     Flu Vaccine QUAD 36+ mos IM -     albuterol (PROVENTIL HFA;VENTOLIN HFA) 108 (90 BASE) MCG/ACT inhaler; Inhale 2 puffs into the lungs every  6 (six) hours as needed for wheezing or shortness of breath.   I have discontinued Ms. Emmanuel's ALBUTEROL IN. I am also having her start on albuterol. Additionally, I am having her maintain her levonorgestrel and mirabegron ER.  Meds ordered this encounter  Medications  . albuterol (PROVENTIL HFA;VENTOLIN HFA) 108 (90 BASE) MCG/ACT inhaler    Sig: Inhale 2 puffs into the lungs every 6 (six) hours as needed for  wheezing or shortness of breath.    Dispense:  1 Inhaler    Refill:  2    Order Specific Question:  Supervising Provider    Answer:  Sherlene Shams [[id number]]     Follow-up: Return in about 4 weeks (around 07/03/2015) for Weight loss.

## 2015-06-05 NOTE — Assessment & Plan Note (Signed)
Inhaler refilled and sent to pharmacy. Will follow.

## 2015-06-05 NOTE — Patient Instructions (Signed)
Bring 7-10 days of food, exercise, and drinks (don't just choose your good days, show me all of the good and bad!)   Follow up in 1 month.   Thanks for getting your flu shot today.

## 2015-06-05 NOTE — Progress Notes (Signed)
Pre visit review using our clinic review tool, if applicable. No additional management support is needed unless otherwise documented below in the visit note. 

## 2015-08-19 ENCOUNTER — Other Ambulatory Visit: Payer: Self-pay

## 2015-08-19 ENCOUNTER — Other Ambulatory Visit: Payer: Self-pay | Admitting: Adult Health

## 2015-08-19 DIAGNOSIS — Z1231 Encounter for screening mammogram for malignant neoplasm of breast: Secondary | ICD-10-CM

## 2015-08-27 ENCOUNTER — Ambulatory Visit (HOSPITAL_COMMUNITY)
Admission: RE | Admit: 2015-08-27 | Discharge: 2015-08-27 | Disposition: A | Discharge status: Home / Self Care | Payer: Managed Care, Other (non HMO) | Source: Ambulatory Visit | Attending: Adult Health | Admitting: Adult Health

## 2015-08-27 DIAGNOSIS — Z1231 Encounter for screening mammogram for malignant neoplasm of breast: Secondary | ICD-10-CM | POA: Insufficient documentation

## 2015-11-06 ENCOUNTER — Other Ambulatory Visit: Payer: Self-pay | Admitting: Nurse Practitioner

## 2015-11-06 ENCOUNTER — Telehealth: Payer: Self-pay | Admitting: Nurse Practitioner

## 2015-11-06 MED ORDER — PROAIR HFA 108 (90 BASE) MCG/ACT IN AERS: 2.0000 | INHALATION_SPRAY | Freq: Four times a day (QID) | RESPIRATORY_TRACT | Status: DC | PRN

## 2015-11-06 NOTE — Telephone Encounter (Signed)
Bonnie Murphy [phone] [place] [facility] [place] regarding wanting to know if she can change pt medication of albuterol (PROVENTIL HFA;VENTOLIN HFA) 108 (90 BASE) MCG/ACT inhaler to Proair? Pharmacist stated she a prescription request. Thank you!

## 2015-11-06 NOTE — Telephone Encounter (Signed)
Pharmacy is requesting a medication change, which would be more cost effective for the pt. The pharmacy wants to switch her from Proventil to ProAir. Please advise okay to fill, thanks

## 2015-11-06 NOTE — Telephone Encounter (Signed)
Sent to pharmacy 

## 2015-11-06 NOTE — Telephone Encounter (Signed)
Noted  

## 2016-01-12 ENCOUNTER — Encounter: Payer: Self-pay | Admitting: Nurse Practitioner

## 2016-01-12 ENCOUNTER — Ambulatory Visit (INDEPENDENT_AMBULATORY_CARE_PROVIDER_SITE_OTHER): Payer: 59 | Admitting: Nurse Practitioner

## 2016-01-12 VITALS — BP 120/72 | HR 63 | Temp 98.1°F | Ht 62.0 in | Wt 171.0 lb

## 2016-01-12 DIAGNOSIS — Z0000 Encounter for general adult medical examination without abnormal findings: Secondary | ICD-10-CM | POA: Diagnosis not present

## 2016-01-12 DIAGNOSIS — Z0001 Encounter for general adult medical examination with abnormal findings: Secondary | ICD-10-CM | POA: Insufficient documentation

## 2016-01-12 LAB — COMPREHENSIVE METABOLIC PANEL
ALK PHOS: 52 U/L (ref 39–117)
ALT: 13 U/L (ref 0–35)
AST: 14 U/L (ref 0–37)
Albumin: 4.6 g/dL (ref 3.5–5.2)
BUN: 10 mg/dL (ref 6–23)
CHLORIDE: 104 meq/L (ref 96–112)
CO2: 27 meq/L (ref 19–32)
Calcium: 9.6 mg/dL (ref 8.4–10.5)
Creatinine, Ser: 0.67 mg/dL (ref 0.40–1.20)
GFR: 100.63 mL/min (ref >60.00)
GLUCOSE: 83 mg/dL (ref 70–99)
POTASSIUM: 4.3 meq/L (ref 3.5–5.1)
SODIUM: 140 meq/L (ref 135–145)
Total Bilirubin: 0.4 mg/dL (ref 0.2–1.2)
Total Protein: 7.3 g/dL (ref 6.0–8.3)

## 2016-01-12 LAB — CBC WITH DIFFERENTIAL/PLATELET
Basophils Absolute: 0 10*3/uL (ref 0.0–0.1)
Basophils Relative: 0.4 % (ref 0.0–3.0)
EOS PCT: 1.1 % (ref 0.0–5.0)
Eosinophils Absolute: 0.1 10*3/uL (ref 0.0–0.7)
HEMATOCRIT: 46.6 % — AB (ref 36.0–46.0)
HEMOGLOBIN: 15.9 g/dL — AB (ref 12.0–15.0)
Lymphocytes Relative: 33.5 % (ref 12.0–46.0)
Lymphs Abs: 3.7 10*3/uL (ref 0.7–4.0)
MCHC: 34 g/dL (ref 30.0–36.0)
MCV: 87.9 fl (ref 78.0–100.0)
MONOS PCT: 3.9 % (ref 3.0–12.0)
Monocytes Absolute: 0.4 10*3/uL (ref 0.1–1.0)
Neutro Abs: 6.8 10*3/uL (ref 1.4–7.7)
Neutrophils Relative %: 61.1 % (ref 43.0–77.0)
Platelets: 302 10*3/uL (ref 150.0–400.0)
RBC: 5.3 Mil/uL — AB (ref 3.87–5.11)
RDW: 13.3 % (ref 11.5–15.5)
WBC: 11.1 10*3/uL — AB (ref 4.0–10.5)

## 2016-01-12 LAB — LIPID PANEL
Cholesterol: 254 mg/dL — ABNORMAL HIGH (ref 0–200)
HDL: 50.3 mg/dL (ref >39.00)
LDL CALC: 182 mg/dL — AB (ref 0–99)
NONHDL: 204.01
Total CHOL/HDL Ratio: 5
Triglycerides: 111 mg/dL (ref 0.0–149.0)
VLDL: 22.2 mg/dL (ref 0.0–40.0)

## 2016-01-12 LAB — HEMOGLOBIN A1C: Hgb A1c MFr Bld: 5.8 % (ref 4.6–6.5)

## 2016-01-12 LAB — TSH: TSH: 1.29 u[IU]/mL (ref 0.35–4.50)

## 2016-01-12 MED ORDER — VARENICLINE TARTRATE 0.5 MG X 11 & 1 MG X 42 PO MISC: ORAL | Status: DC

## 2016-01-12 NOTE — Progress Notes (Signed)
Patient ID: Bonnie Murphy, female    DOB: 06/11/1971  Age: 45 y.o. MRN: [id number]  CC: Annual Exam   HPI Bonnie Murphy presents for Annual Exam.   1) Annual Physical   Diet- Pt reports she does not like fruits and vegetables so eats primarily meat, cheese, and sweets. Drinking more water   Exercise- No formal   Immunizations- UTD   Mamm[facility]- [facility], good for 1 year  PAP- OB/GYN  Eye Exam- 2 years ago  Dental Exam- UTD  LMP- IUD  Labs- cholesterol, CBC w/ diff, TSH  Depression- Neg.  Refills: Inhaler  Smoking- patches not helpful, she is motivated, lozenges- not helpful, gum- not helpful. Wanting to try Chantix  2) Acute- nagging headache daily for several months. Ibuprofen helpful, No recent eye exam   History Bonnie Murphy has a past medical history of Asthma; Cyst of right breast; Wheezing (11/15/2013); IUD (intrauterine device) in place (11/21/2014); and OAB (overactive bladder) (11/21/2014).   She has past surgical history that includes Cesarean section.   Her family history includes Cancer in her father, paternal aunt, and paternal uncle; Cirrhosis in her father and paternal grandmother; Diabetes in her mother.She reports that she has been smoking Cigarettes.  She has a 15 pack-year smoking history. She has never used smokeless tobacco. She reports that she drinks about 0.6 oz of alcohol per week. She reports that she does not use illicit drugs.  Outpatient Prescriptions Prior to Visit  Medication Sig Dispense Refill  . levonorgestrel (MIRENA) 20 MCG/24HR IUD 1 each by Intrauterine route once.    Marland Kitchen. PROAIR HFA 108 (90 Base) MCG/ACT inhaler Inhale 2 puffs into the lungs every 6 (six) hours as needed for wheezing or shortness of breath. 1 Inhaler 2   No facility-administered medications prior to visit.    ROS Review of Systems  Constitutional: Negative for fever, chills, diaphoresis, fatigue and unexpected weight change.  HENT: Negative for tinnitus and trouble swallowing.    Eyes: Negative for visual disturbance.  Respiratory: Negative for chest tightness, shortness of breath and wheezing.   Cardiovascular: Negative for chest pain, palpitations and leg swelling.  Gastrointestinal: Negative for nausea, vomiting, abdominal pain, diarrhea, constipation and blood in stool.  Endocrine: Negative for polydipsia, polyphagia and polyuria.  Genitourinary: Negative for dysuria, hematuria, vaginal discharge and vaginal pain.  Musculoskeletal: Negative for myalgias, back pain, arthralgias and gait problem.  Skin: Negative for color change and rash.  Neurological: Positive for headaches. Negative for dizziness, weakness and numbness.  Hematological: Does not bruise/bleed easily.  Psychiatric/Behavioral: Negative for suicidal ideas and sleep disturbance. The patient is not nervous/anxious.     Objective:  BP 120/72 mmHg  Pulse 63  Temp(Src) 98.1 F (36.7 C) (Oral)  Ht 5\' 2"  (1.575 m)  Wt 171 lb (77.565 kg)  BMI 31.27 kg/m2  SpO2 97%  Physical Exam  Constitutional: She is oriented to person, place, and time. She appears well-developed and well-nourished. No distress.  HENT:  Head: Normocephalic and atraumatic.  Right Ear: External ear normal.  Left Ear: External ear normal.  Nose: Nose normal.  Mouth/Throat: Oropharynx is clear and moist. No oropharyngeal exudate.  TMs and canals clear bilaterally  Eyes: Conjunctivae and EOM are normal. Pupils are equal, round, and reactive to light. Right eye exhibits no discharge. Left eye exhibits no discharge. No scleral icterus.  Neck: Normal range of motion. Neck supple. No thyromegaly present.  Cardiovascular: Normal rate, regular rhythm, normal heart sounds and intact distal pulses.  Exam reveals no gallop and no friction rub.   No murmur heard. Pulmonary/Chest: Effort normal and breath sounds normal. No respiratory distress. She has no wheezes. She has no rales. She exhibits no tenderness.  Deferred breast exam to GYN   Abdominal: Soft. Bowel sounds are normal. She exhibits no distension and no mass. There is no tenderness. There is no rebound and no guarding.  Genitourinary:  Deferred to GYN  Musculoskeletal: Normal range of motion. She exhibits no edema or tenderness.  Lymphadenopathy:    She has no cervical adenopathy.  Neurological: She is alert and oriented to person, place, and time. She has normal reflexes. No cranial nerve deficit. She exhibits normal muscle tone. Coordination normal.  Skin: Skin is warm and dry. No rash noted. She is not diaphoretic. No erythema. No pallor.  Tanning bed exposure  Psychiatric: She has a normal mood and affect. Her behavior is normal. Judgment and thought content normal.   Assessment & Plan:   Bonnie Murphy was seen today for annual exam.  Diagnoses and all orders for this visit:  Routine general medical examination at a health care facility -     Comprehensive metabolic panel -     CBC with Differential/Platelet -     Hemoglobin A1c -     Lipid panel -     TSH  Other orders -     varenicline (CHANTIX STARTING MONTH PAK) 0.5 MG X 11 & 1 MG X 42 tablet; Take one 0.5 mg tablet by mouth once daily for 3 days, then increase to one 0.5 mg tablet twice daily for 4 days, then increase to one 1 mg tablet twice daily.   I am having Bonnie Murphy start on varenicline. I am also having her maintain her levonorgestrel and PROAIR HFA.  Meds ordered this encounter  Medications  . varenicline (CHANTIX STARTING MONTH PAK) 0.5 MG X 11 & 1 MG X 42 tablet    Sig: Take one 0.5 mg tablet by mouth once daily for 3 days, then increase to one 0.5 mg tablet twice daily for 4 days, then increase to one 1 mg tablet twice daily.    Dispense:  53 tablet    Refill:  0    Order Specific Question:  Supervising Provider    Answer:  Bonnie Murphy [[id number]]     Follow-up: Return in about 1 year (around 01/11/2017) for CPE.

## 2016-01-12 NOTE — Addendum Note (Signed)
Addended by: Warden FillersWRIGHT, LATOYA S on: 01/12/2016 03:25 PM   Modules accepted: Kipp BroodSmartSet

## 2016-01-12 NOTE — Progress Notes (Signed)
Pre visit review using our clinic review tool, if applicable. No additional management support is needed unless otherwise documented below in the visit note. 

## 2016-01-12 NOTE — Assessment & Plan Note (Signed)
Discussed acute and chronic issues. Reviewed health maintenance measures, PFSHx, and immunizations. Obtain routine labs TSH, Lipid panel, CBC w/ diff, A1c, and CMET.   Start Chantix Deferred PAP and Breast exam to OB.GYN Health maintenance UTD, suggested new eye exam due to HA  Declined HIV testing

## 2016-01-12 NOTE — Patient Instructions (Signed)
Mr. Bonnie Murphy- please give your wife a shoulder massage!   Health Maintenance, Female Adopting a healthy lifestyle and getting preventive care can go a long way to promote health and wellness. Talk with your health care provider about what schedule of regular examinations is right for you. This is a good chance for you to check in with your provider about disease prevention and staying healthy. In between checkups, there are plenty of things you can do on your own. Experts have done a lot of research about which lifestyle changes and preventive measures are most likely to keep you healthy. Ask your health care provider for more information. WEIGHT AND DIET  Eat a healthy diet  Be sure to include plenty of vegetables, fruits, low-fat dairy products, and lean protein.  Do not eat a lot of foods high in solid fats, added sugars, or salt.  Get regular exercise. This is one of the most important things you can do for your health.  Most adults should exercise for at least 150 minutes each week. The exercise should increase your heart rate and make you sweat (moderate-intensity exercise).  Most adults should also do strengthening exercises at least twice a week. This is in addition to the moderate-intensity exercise.  Maintain a healthy weight  Body mass index (BMI) is a measurement that can be used to identify possible weight problems. It estimates body fat based on height and weight. Your health care provider can help determine your BMI and help you achieve or maintain a healthy weight.  For females 34 years of age and older:   A BMI below 18.5 is considered underweight.  A BMI of 18.5 to 24.9 is normal.  A BMI of 25 to 29.9 is considered overweight.  A BMI of 30 and above is considered obese.  Watch levels of cholesterol and blood lipids  You should start having your blood tested for lipids and cholesterol at 46 years of age, then have this test every 5 years.  You may need to have your  cholesterol levels checked more often if:  Your lipid or cholesterol levels are high.  You are older than 46 years of age.  You are at high risk for heart disease.  CANCER SCREENING   Lung Cancer  Lung cancer screening is recommended for adults 12-57 years old who are at high risk for lung cancer because of a history of smoking.  A yearly low-dose CT scan of the lungs is recommended for people who:  Currently smoke.  Have quit within the past 15 years.  Have at least a 30-pack-year history of smoking. A pack year is smoking an average of one pack of cigarettes a day for 1 year.  Yearly screening should continue until it has been 15 years since you quit.  Yearly screening should stop if you develop a health problem that would prevent you from having lung cancer treatment.  Breast Cancer  Practice breast self-awareness. This means understanding how your breasts normally appear and feel.  It also means doing regular breast self-exams. Let your health care provider know about any changes, no matter how small.  If you are in your 20s or 30s, you should have a clinical breast exam (CBE) by a health care provider every 1-3 years as part of a regular health exam.  If you are 1 or older, have a CBE every year. Also consider having a breast X-ray (mammogram) every year.  If you have a family history of breast cancer, talk  to your health care provider about genetic screening.  If you are at high risk for breast cancer, talk to your health care provider about having an MRI and a mammogram every year.  Breast cancer gene (BRCA) assessment is recommended for women who have family members with BRCA-related cancers. BRCA-related cancers include:  Breast.  Ovarian.  Tubal.  Peritoneal cancers.  Results of the assessment will determine the need for genetic counseling and BRCA1 and BRCA2 testing. Cervical Cancer Your health care provider may recommend that you be screened regularly  for cancer of the pelvic organs (ovaries, uterus, and vagina). This screening involves a pelvic examination, including checking for microscopic changes to the surface of your cervix (Pap test). You may be encouraged to have this screening done every 3 years, beginning at age 61.  For women ages 66-65, health care providers may recommend pelvic exams and Pap testing every 3 years, or they may recommend the Pap and pelvic exam, combined with testing for human papilloma virus (HPV), every 5 years. Some types of HPV increase your risk of cervical cancer. Testing for HPV may also be done on women of any age with unclear Pap test results.  Other health care providers may not recommend any screening for nonpregnant women who are considered low risk for pelvic cancer and who do not have symptoms. Ask your health care provider if a screening pelvic exam is right for you.  If you have had past treatment for cervical cancer or a condition that could lead to cancer, you need Pap tests and screening for cancer for at least 20 years after your treatment. If Pap tests have been discontinued, your risk factors (such as having a new sexual partner) need to be reassessed to determine if screening should resume. Some women have medical problems that increase the chance of getting cervical cancer. In these cases, your health care provider may recommend more frequent screening and Pap tests. Colorectal Cancer  This type of cancer can be detected and often prevented.  Routine colorectal cancer screening usually begins at 46 years of age and continues through 46 years of age.  Your health care provider may recommend screening at an earlier age if you have risk factors for colon cancer.  Your health care provider may also recommend using home test kits to check for hidden blood in the stool.  A small camera at the end of a tube can be used to examine your colon directly (sigmoidoscopy or colonoscopy). This is done to check  for the earliest forms of colorectal cancer.  Routine screening usually begins at age 109.  Direct examination of the colon should be repeated every 5-10 years through 46 years of age. However, you may need to be screened more often if early forms of precancerous polyps or small growths are found. Skin Cancer  Check your skin from head to toe regularly.  Tell your health care provider about any new moles or changes in moles, especially if there is a change in a mole's shape or color.  Also tell your health care provider if you have a mole that is larger than the size of a pencil eraser.  Always use sunscreen. Apply sunscreen liberally and repeatedly throughout the day.  Protect yourself by wearing long sleeves, pants, a wide-brimmed hat, and sunglasses whenever you are outside. HEART DISEASE, DIABETES, AND HIGH BLOOD PRESSURE   High blood pressure causes heart disease and increases the risk of stroke. High blood pressure is more likely to develop  in:  People who have blood pressure in the high end of the normal range (130-139/85-89 mm Hg).  People who are overweight or obese.  People who are African American.  If you are 80-31 years of age, have your blood pressure checked every 3-5 years. If you are 29 years of age or older, have your blood pressure checked every year. You should have your blood pressure measured twice--once when you are at a hospital or clinic, and once when you are not at a hospital or clinic. Record the average of the two measurements. To check your blood pressure when you are not at a hospital or clinic, you can use:  An automated blood pressure machine at a pharmacy.  A home blood pressure monitor.  If you are between 81 years and 53 years old, ask your health care provider if you should take aspirin to prevent strokes.  Have regular diabetes screenings. This involves taking a blood sample to check your fasting blood sugar level.  If you are at a normal  weight and have a low risk for diabetes, have this test once every three years after 46 years of age.  If you are overweight and have a high risk for diabetes, consider being tested at a younger age or more often. PREVENTING INFECTION  Hepatitis B  If you have a higher risk for hepatitis B, you should be screened for this virus. You are considered at high risk for hepatitis B if:  You were born in a country where hepatitis B is common. Ask your health care provider which countries are considered high risk.  Your parents were born in a high-risk country, and you have not been immunized against hepatitis B (hepatitis B vaccine).  You have HIV or AIDS.  You use needles to inject street drugs.  You live with someone who has hepatitis B.  You have had sex with someone who has hepatitis B.  You get hemodialysis treatment.  You take certain medicines for conditions, including cancer, organ transplantation, and autoimmune conditions. Hepatitis C  Blood testing is recommended for:  Everyone born from [birth year] through 1966.  Anyone with known risk factors for hepatitis C. Sexually transmitted infections (STIs)  You should be screened for sexually transmitted infections (STIs) including gonorrhea and chlamydia if:  You are sexually active and are younger than 46 years of age.  You are older than 46 years of age and your health care provider tells you that you are at risk for this type of infection.  Your sexual activity has changed since you were last screened and you are at an increased risk for chlamydia or gonorrhea. Ask your health care provider if you are at risk.  If you do not have HIV, but are at risk, it may be recommended that you take a prescription medicine daily to prevent HIV infection. This is called pre-exposure prophylaxis (PrEP). You are considered at risk if:  You are sexually active and do not regularly use condoms or know the HIV status of your partner(s).  You take  drugs by injection.  You are sexually active with a partner who has HIV. Talk with your health care provider about whether you are at high risk of being infected with HIV. If you choose to begin PrEP, you should first be tested for HIV. You should then be tested every 3 months for as long as you are taking PrEP.  PREGNANCY   If you are premenopausal and you may become pregnant, ask  your health care provider about preconception counseling.  If you may become pregnant, take 400 to 800 micrograms (mcg) of folic acid every day.  If you want to prevent pregnancy, talk to your health care provider about birth control (contraception). OSTEOPOROSIS AND MENOPAUSE   Osteoporosis is a disease in which the bones lose minerals and strength with aging. This can result in serious bone fractures. Your risk for osteoporosis can be identified using a bone density scan.  If you are 41 years of age or older, or if you are at risk for osteoporosis and fractures, ask your health care provider if you should be screened.  Ask your health care provider whether you should take a calcium or vitamin D supplement to lower your risk for osteoporosis.  Menopause may have certain physical symptoms and risks.  Hormone replacement therapy may reduce some of these symptoms and risks. Talk to your health care provider about whether hormone replacement therapy is right for you.  HOME CARE INSTRUCTIONS   Schedule regular health, dental, and eye exams.  Stay current with your immunizations.   Do not use any tobacco products including cigarettes, chewing tobacco, or electronic cigarettes.  If you are pregnant, do not drink alcohol.  If you are breastfeeding, limit how much and how often you drink alcohol.  Limit alcohol intake to no more than 1 drink per day for nonpregnant women. One drink equals 12 ounces of beer, 5 ounces of wine, or 1 ounces of hard liquor.  Do not use street drugs.  Do not share  needles.  Ask your health care provider for help if you need support or information about quitting drugs.  Tell your health care provider if you often feel depressed.  Tell your health care provider if you have ever been abused or do not feel safe at home.   This information is not intended to replace advice given to you by your health care provider. Make sure you discuss any questions you have with your health care provider.   Document Released: 03/07/2011 Document Revised: 09/12/2014 Document Reviewed: 07/24/2013 Elsevier Interactive Patient Education [employer].

## 2016-01-14 ENCOUNTER — Encounter: Payer: Managed Care, Other (non HMO) | Admitting: Nurse Practitioner

## 2016-09-14 ENCOUNTER — Other Ambulatory Visit: Payer: Self-pay | Admitting: Adult Health

## 2016-09-14 DIAGNOSIS — Z1231 Encounter for screening mammogram for malignant neoplasm of breast: Secondary | ICD-10-CM

## 2016-09-23 ENCOUNTER — Ambulatory Visit (HOSPITAL_COMMUNITY)
Admission: RE | Admit: 2016-09-23 | Discharge: 2016-09-23 | Disposition: A | Discharge status: Home / Self Care | Payer: 59 | Source: Ambulatory Visit | Attending: Adult Health | Admitting: Adult Health

## 2016-09-23 DIAGNOSIS — Z1231 Encounter for screening mammogram for malignant neoplasm of breast: Secondary | ICD-10-CM | POA: Insufficient documentation

## 2016-12-12 ENCOUNTER — Telehealth: Payer: Self-pay | Admitting: Family Medicine

## 2016-12-12 DIAGNOSIS — Z6831 Body mass index (BMI) 31.0-31.9, adult: Secondary | ICD-10-CM

## 2016-12-12 DIAGNOSIS — Z130 Encounter for screening for diseases of the blood and blood-forming organs and certain disorders involving the immune mechanism: Secondary | ICD-10-CM

## 2016-12-12 DIAGNOSIS — E6609 Other obesity due to excess calories: Secondary | ICD-10-CM

## 2016-12-12 DIAGNOSIS — E785 Hyperlipidemia, unspecified: Secondary | ICD-10-CM

## 2016-12-12 DIAGNOSIS — Z1329 Encounter for screening for other suspected endocrine disorder: Secondary | ICD-10-CM

## 2016-12-12 NOTE — Telephone Encounter (Signed)
Pt needs lab work before physical appt on 02/03/2017. Order needed please and thank you!  Call pt @ [phone].

## 2016-12-12 NOTE — Telephone Encounter (Signed)
Orders placed.

## 2016-12-12 NOTE — Telephone Encounter (Signed)
Please advise 

## 2016-12-13 NOTE — Telephone Encounter (Signed)
Ok. Pt is scheduled for her fasting labs. Thank you!

## 2016-12-13 NOTE — Telephone Encounter (Signed)
Please schedule

## 2016-12-26 ENCOUNTER — Encounter: Payer: Self-pay | Admitting: Adult Health

## 2016-12-26 ENCOUNTER — Ambulatory Visit (INDEPENDENT_AMBULATORY_CARE_PROVIDER_SITE_OTHER): Payer: 59 | Admitting: Adult Health

## 2016-12-26 ENCOUNTER — Other Ambulatory Visit (HOSPITAL_COMMUNITY)
Admission: RE | Admit: 2016-12-26 | Discharge: 2016-12-26 | Disposition: A | Discharge status: Home / Self Care | Payer: 59 | Source: Ambulatory Visit | Attending: Adult Health | Admitting: Adult Health

## 2016-12-26 VITALS — BP 130/78 | HR 62 | Ht 62.0 in | Wt 175.5 lb

## 2016-12-26 DIAGNOSIS — Z01419 Encounter for gynecological examination (general) (routine) without abnormal findings: Secondary | ICD-10-CM

## 2016-12-26 DIAGNOSIS — Z1211 Encounter for screening for malignant neoplasm of colon: Secondary | ICD-10-CM

## 2016-12-26 DIAGNOSIS — N951 Menopausal and female climacteric states: Secondary | ICD-10-CM

## 2016-12-26 DIAGNOSIS — Z1212 Encounter for screening for malignant neoplasm of rectum: Secondary | ICD-10-CM | POA: Diagnosis not present

## 2016-12-26 DIAGNOSIS — Z975 Presence of (intrauterine) contraceptive device: Secondary | ICD-10-CM

## 2016-12-26 LAB — HEMOCCULT GUIAC POC 1CARD (OFFICE): FECAL OCCULT BLD: NEGATIVE

## 2016-12-26 NOTE — Progress Notes (Signed)
Subjective:     Patient ID: Bonnie Murphy, female   DOB: 05/28/1971, 46 y.o.   MRN: [id number]  HPI [name] is a 46 year old white female in for pap smear, she gets physical with PCP at [facility] in [place].   Review of Systems Patient denies any headaches, hearing loss, fatigue, blurred vision, shortness of breath, chest pain, abdominal pain, problems with bowel movements, urination, or intercourse. No joint pain or mood swings.Had night sweats several weeks ago. Has IUD. Has wheezing at times and has inhaler.  Reviewed past medical,surgical, social and family history. Reviewed medications and allergies.     Objective:   Physical Exam BP 130/78 (BP Location: Left Arm, Patient Position: Sitting, Cuff Size: Normal)   Pulse 62   Ht  (1.575 m)   Wt 175 lb 8 oz (79.6 kg)   BMI 32.10 kg/m  Skin warm and dry.Pelvic: external genitalia is normal in appearance no lesions, vagina: has good color, moisture and rugae,urethra has no lesions or masses noted, cervix:smooth, +IUD strings at os, pap with HPV performed, uterus: normal size, shape and contour, non tender, no masses felt, adnexa: no masses or tenderness noted. Bladder is non tender and no masses felt. On rectal exam has good tone, no polyps or hemorrhoids felt and hemoccult was negative.   PHQ 2 score 0.  Assessment:     1. Encounter for gynecological examination with Papanicolaou smear of cervix   2. Screening for colorectal cancer   3. IUD (intrauterine device) in place   4. Perimenopausal symptoms       Plan:     Physical in 1 year Pap in 3 if normal Mammogram yearly(had normal 09/2016) Labs with PCP  Review handout on perimenopause

## 2016-12-26 NOTE — Patient Instructions (Addendum)
Pap in 3 years if normal Physical in 1 yearly  Perimenopause Perimenopause is the time when your body begins to move into the menopause (no menstrual period for 12 straight months). It is a natural process. Perimenopause can begin 2-8 years before the menopause and usually lasts for 1 year after the menopause. During this time, your ovaries may or may not produce an egg. The ovaries vary in their production of estrogen and progesterone hormones each month. This can cause irregular menstrual periods, difficulty getting pregnant, vaginal bleeding between periods, and uncomfortable symptoms. What are the causes?  Irregular production of the ovarian hormones, estrogen and progesterone, and not ovulating every month. Other causes include:  Tumor of the pituitary gland in the brain.  Medical disease that affects the ovaries.  Radiation treatment.  Chemotherapy.  Unknown causes.  Heavy smoking and excessive alcohol intake can bring on perimenopause sooner. What are the signs or symptoms?  Hot flashes.  Night sweats.  Irregular menstrual periods.  Decreased sex drive.  Vaginal dryness.  Headaches.  Mood swings.  Depression.  Memory problems.  Irritability.  Tiredness.  Weight gain.  Trouble getting pregnant.  The beginning of losing bone cells (osteoporosis).  The beginning of hardening of the arteries (atherosclerosis). How is this diagnosed? Your health care provider will make a diagnosis by analyzing your age, menstrual history, and symptoms. He or she will do a physical exam and note any changes in your body, especially your female organs. Female hormone tests may or may not be helpful depending on the amount of female hormones you produce and when you produce them. However, other hormone tests may be helpful to rule out other problems. How is this treated? In some cases, no treatment is needed. The decision on whether treatment is necessary during the perimenopause  should be made by you and your health care provider based on how the symptoms are affecting you and your lifestyle. Various treatments are available, such as:  Treating individual symptoms with a specific medicine for that symptom.  Herbal medicines that can help specific symptoms.  Counseling.  Group therapy. Follow these instructions at home:  Keep track of your menstrual periods (when they occur, how heavy they are, how long between periods, and how long they last) as well as your symptoms and when they started.  Only take over-the-counter or prescription medicines as directed by your health care provider.  Sleep and rest.  Exercise.  Eat a diet that contains calcium (good for your bones) and soy (acts like the estrogen hormone).  Do not smoke.  Avoid alcoholic beverages.  Take vitamin supplements as recommended by your health care provider. Taking vitamin E may help in certain cases.  Take calcium and vitamin D supplements to help prevent bone loss.  Group therapy is sometimes helpful.  Acupuncture may help in some cases. Contact a health care provider if:  You have questions about any symptoms you are having.  You need a referral to a specialist (gynecologist, psychiatrist, or psychologist). Get help right away if:  You have vaginal bleeding.  Your period lasts longer than 8 days.  Your periods are recurring sooner than 21 days.  You have bleeding after intercourse.  You have severe depression.  You have pain when you urinate.  You have severe headaches.  You have vision problems. This information is not intended to replace advice given to you by your health care provider. Make sure you discuss any questions you have with your health care provider.  Document Released: 09/29/2004 Document Revised: 01/28/2016 Document Reviewed: 03/21/2013 Elsevier Interactive Patient Education  2017 [employer].

## 2016-12-27 LAB — CYTOLOGY - PAP
DIAGNOSIS: NEGATIVE
HPV: NOT DETECTED

## 2016-12-30 ENCOUNTER — Ambulatory Visit: Payer: 59 | Admitting: Family Medicine

## 2017-01-09 ENCOUNTER — Other Ambulatory Visit: Payer: Self-pay | Admitting: Nurse Practitioner

## 2017-01-27 ENCOUNTER — Other Ambulatory Visit (INDEPENDENT_AMBULATORY_CARE_PROVIDER_SITE_OTHER): Payer: 59

## 2017-01-27 DIAGNOSIS — Z130 Encounter for screening for diseases of the blood and blood-forming organs and certain disorders involving the immune mechanism: Secondary | ICD-10-CM

## 2017-01-27 DIAGNOSIS — E6609 Other obesity due to excess calories: Secondary | ICD-10-CM | POA: Diagnosis not present

## 2017-01-27 DIAGNOSIS — Z1329 Encounter for screening for other suspected endocrine disorder: Secondary | ICD-10-CM

## 2017-01-27 DIAGNOSIS — Z6831 Body mass index (BMI) 31.0-31.9, adult: Secondary | ICD-10-CM | POA: Diagnosis not present

## 2017-01-27 DIAGNOSIS — E785 Hyperlipidemia, unspecified: Secondary | ICD-10-CM | POA: Diagnosis not present

## 2017-01-27 LAB — LIPID PANEL
CHOL/HDL RATIO: 4
Cholesterol: 214 mg/dL — ABNORMAL HIGH (ref 0–200)
HDL: 49.4 mg/dL (ref >39.00)
LDL Cholesterol: 140 mg/dL — ABNORMAL HIGH (ref 0–99)
NONHDL: 164.5
Triglycerides: 124 mg/dL (ref 0.0–149.0)
VLDL: 24.8 mg/dL (ref 0.0–40.0)

## 2017-01-27 LAB — CBC
HCT: 44.2 % (ref 36.0–46.0)
Hemoglobin: 14.9 g/dL (ref 12.0–15.0)
MCHC: 33.8 g/dL (ref 30.0–36.0)
MCV: 89.2 fl (ref 78.0–100.0)
Platelets: 271 10*3/uL (ref 150.0–400.0)
RBC: 4.96 Mil/uL (ref 3.87–5.11)
RDW: 13.4 % (ref 11.5–15.5)
WBC: 10.7 10*3/uL — AB (ref 4.0–10.5)

## 2017-01-27 LAB — COMPREHENSIVE METABOLIC PANEL
ALBUMIN: 4.2 g/dL (ref 3.5–5.2)
ALK PHOS: 49 U/L (ref 39–117)
ALT: 15 U/L (ref 0–35)
AST: 14 U/L (ref 0–37)
BUN: 15 mg/dL (ref 6–23)
CO2: 27 mEq/L (ref 19–32)
CREATININE: 0.69 mg/dL (ref 0.40–1.20)
Calcium: 9.1 mg/dL (ref 8.4–10.5)
Chloride: 108 mEq/L (ref 96–112)
GFR: 96.84 mL/min (ref >60.00)
GLUCOSE: 91 mg/dL (ref 70–99)
POTASSIUM: 3.7 meq/L (ref 3.5–5.1)
SODIUM: 140 meq/L (ref 135–145)
TOTAL PROTEIN: 6.6 g/dL (ref 6.0–8.3)
Total Bilirubin: 0.5 mg/dL (ref 0.2–1.2)

## 2017-01-27 LAB — TSH: TSH: 3.2 u[IU]/mL (ref 0.35–4.50)

## 2017-01-27 LAB — HEMOGLOBIN A1C: Hgb A1c MFr Bld: 5.7 % (ref 4.6–6.5)

## 2017-02-03 ENCOUNTER — Encounter: Payer: Self-pay | Admitting: Family Medicine

## 2017-02-03 ENCOUNTER — Ambulatory Visit (INDEPENDENT_AMBULATORY_CARE_PROVIDER_SITE_OTHER): Payer: 59 | Admitting: Family Medicine

## 2017-02-03 VITALS — BP 116/78 | HR 68 | Temp 99.1°F | Ht 62.0 in | Wt 176.0 lb

## 2017-02-03 DIAGNOSIS — R062 Wheezing: Secondary | ICD-10-CM

## 2017-02-03 DIAGNOSIS — D72829 Elevated white blood cell count, unspecified: Secondary | ICD-10-CM

## 2017-02-03 DIAGNOSIS — Z0001 Encounter for general adult medical examination with abnormal findings: Secondary | ICD-10-CM

## 2017-02-03 DIAGNOSIS — F17200 Nicotine dependence, unspecified, uncomplicated: Secondary | ICD-10-CM | POA: Diagnosis not present

## 2017-02-03 LAB — CBC
HCT: 44.9 % (ref 36.0–46.0)
HEMOGLOBIN: 15.4 g/dL — AB (ref 12.0–15.0)
MCHC: 34.3 g/dL (ref 30.0–36.0)
MCV: 89 fl (ref 78.0–100.0)
PLATELETS: 286 10*3/uL (ref 150.0–400.0)
RBC: 5.05 Mil/uL (ref 3.87–5.11)
RDW: 13.3 % (ref 11.5–15.5)
WBC: 9.5 10*3/uL (ref 4.0–10.5)

## 2017-02-03 MED ORDER — BUPROPION HCL ER (SR) 150 MG PO TB12: 150.0000 mg | ORAL_TABLET | Freq: Two times a day (BID) | ORAL | 2 refills | Status: DC

## 2017-02-03 NOTE — Assessment & Plan Note (Addendum)
She is ready to quit. We'll start her on Wellbutrin. Directions in AVS.

## 2017-02-03 NOTE — Assessment & Plan Note (Signed)
Continues to be an intermittent issue. Uses her albuterol relatively frequently. She'll continue this though we will obtain PFTs to evaluate further. Prior chest x-ray reviewed.

## 2017-02-03 NOTE — Patient Instructions (Addendum)
Nice to meet you. Please work on diet and exercise. I have included dietary instructions below. We'll start you on Wellbutrin for smoking cessation. Please start this one week prior to your quit date. We'll continue this for 12 weeks. We will get you set up for pulmonary function testing given your wheezing.  Please start the Wellbutrin at 150 mg (one tablet) by mouth daily for 3 days, then increase to 150 mg (one tablet) by mouth twice daily.  Diet Recommendations  Starchy (carb) foods: Bread, rice, pasta, potatoes, corn, cereal, grits, crackers, bagels, muffins, all baked goods.  (Fruits, milk, and yogurt also have carbohydrate, but most of these foods will not spike your blood sugar as the starchy foods will.)  A few fruits do cause high blood sugars; use small portions of bananas (limit to 1/2 at a time), grapes, watermelon, oranges, and most tropical fruits.    Protein foods: Meat, fish, poultry, eggs, dairy foods, and beans such as pinto and kidney beans (beans also provide carbohydrate).   1. Eat at least 3 meals and 1-2 snacks per day. Never go more than 4-5 hours while awake without eating. Eat breakfast within the first hour of getting up.   2. Limit starchy foods to TWO per meal and ONE per snack. ONE portion of a starchy  food is equal to the following:   - ONE slice of bread (or its equivalent, such as half of a hamburger bun).   - 1/2 cup of a "scoopable" starchy food such as potatoes or rice.   - 15 grams of carbohydrate as shown on food label.  3. Include at every meal: a protein food, a carb food, and vegetables and/or fruit.   - Obtain twice the volume of veg's as protein or carbohydrate foods for both lunch and dinner.   - Fresh or frozen veg's are best.   - Keep frozen veg's on hand for a quick vegetable serving.

## 2017-02-03 NOTE — Progress Notes (Signed)
Marikay Alar, MD Phone: [phone]  Bonnie Murphy is a 46 y.o. female who presents today for physical exam.  Does not exercise though does get 16,000 steps per day at work. Diet is not good. Does not eat many vegetables. Does eat a lot of sweets. Mammogram up-to-date. Pap smear up-to-date. No family history colon cancer. Tetanus vaccination up-to-date. Reports prior HIV testing when donating blood. Smokes one pack per day. Has done so for 31 years. She is ready to quit. Has tried patches and lozenges in the past with little benefit. Does report she can hear herself wheezing and notes some cough and chest congestion that have been chronic. Uses an albuterol inhaler about once a day. No alcohol use. No illicit drug use.  Active Ambulatory Problems    Diagnosis Date Noted  . Wheezing 11/15/2013  . Tobacco use disorder 11/17/2014  . IUD (intrauterine device) in place 11/21/2014  . OAB (overactive bladder) 11/21/2014  . Obese 06/05/2015  . Encounter for general adult medical examination with abnormal findings 01/12/2016   Resolved Ambulatory Problems    Diagnosis Date Noted  . Encounter to establish care 11/17/2014   Past Medical History:  Diagnosis Date  . Asthma   . Cyst of right breast   . IUD (intrauterine device) in place 11/21/2014  . OAB (overactive bladder) 11/21/2014  . Wheezing 11/15/2013    Family History  Problem Relation Age of Onset  . Diabetes Mother   . Cancer Father        lung  . Cirrhosis Father   . Cancer Paternal Aunt        lung  . Cancer Paternal Uncle        lung  . Cirrhosis Paternal Grandmother     Social History   Social History  . Marital status: Married    Spouse name: N/A  . Number of children: N/A  . Years of education: N/A   Occupational History  . Not on file.   Social History Main Topics  . Smoking status: Current Every Day Smoker    Packs/day: 1.00    Years: 31.00    Types: Cigarettes  . Smokeless tobacco: Never Used    . Alcohol use 0.6 oz/week    1 Standard drinks or equivalent per week     Comment: socially  . Drug use: No  . Sexual activity: Yes    Partners: Male    Birth control/ protection: IUD     Comment: Husband    Other Topics Concern  . 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 CV:  Negative for chest pain, dyspnea, edema, palpitations Resp: Positive for cough, negative for dyspnea, hemoptysis GI: Negative for nausea, vomiting, diarrhea, constipation, abdominal pain, melena, hematochezia. GU: Negative for dysuria, incontinence, urinary hesitance, hematuria, vaginal or penile discharge, polyuria, sexual difficulty, lumps in testicle or breasts MSK: Negative for muscle cramps or aches, joint pain or swelling Neuro: Negative for headaches, weakness, numbness, dizziness, passing out/fainting Psych: Negative for depression, anxiety, memory problems  Objective  Physical Exam Vitals:   02/03/17 1428  BP: 116/78  Pulse: 68  Temp: 99.1 F (37.3 C)    BP Readings from Last 3 Encounters:  02/03/17 116/78  12/26/16 130/78  01/12/16 120/72   Wt Readings from Last 3 Encounters:  02/03/17 176 lb (79.8 kg)  12/26/16 175 lb 8 oz (79.6 kg)  01/12/16 171 lb (77.6 kg)    Physical Exam  Constitutional: No distress.  HENT:  Head: Normocephalic and atraumatic.  Mouth/Throat: Oropharynx is clear and moist. No oropharyngeal exudate.  Eyes: Conjunctivae are normal. Pupils are equal, round, and reactive to light.  Cardiovascular: Normal rate, regular rhythm and  normal heart sounds.   Pulmonary/Chest: Effort normal. No respiratory distress. She has wheezes (rare scattered wheezes). She has no rales.  Abdominal: Soft. Bowel sounds are normal. She exhibits no distension. There is no tenderness. There is no rebound and no guarding.  Musculoskeletal: She exhibits no edema.  Neurological: She is alert. Gait normal.  Skin: Skin is warm and dry. She is not diaphoretic.  Psychiatric: Mood and affect normal.     Assessment/Plan:   Encounter for general adult medical examination with abnormal findings Physical exam completed. Advised on diet and exercise and given dietary instructions. Mammogram and Pap smear up-to-date. Patient is interested in smoking cessation. Discussed potential options and we opted for Wellbutrin. Instructions to taper on to this were given in the AVS. Lab work previously done. Cholesterol improved from prior. She is prediabetic. Discussed diet and exercise for these things. WBC minimally elevated. We will recheck that today.  Wheezing Continues to be an intermittent issue. Uses her albuterol relatively frequently. She'll continue this though we will obtain PFTs to evaluate further. Prior chest x-ray reviewed.  Tobacco use disorder She is ready to quit. We'll start her on Wellbutrin. Directions in AVS.   Orders Placed This Encounter  Procedures  . CBC  . Pulmonary function test    Standing Status:   Future    Standing Expiration Date:   02/03/2018    Order Specific Question:   Where should this test be performed?    Answer:   [facility]    Order Specific Question:   Full PFT: includes the following: basic spirometry, spirometry pre & post bronchodilator, diffusion capacity (DLCO), lung volumes    Answer:   Full PFT    Order Specific Question:   Methacholine challenge    Answer:   Yes    Meds ordered this encounter  Medications  . buPROPion (WELLBUTRIN SR) 150 MG 12 hr tablet    Sig: Take 1 tablet (150 mg total) by  mouth 2 (two) times daily.    Dispense:  60 tablet    Refill:  2     Marikay AlarEric Giani Betzold, MD [facility]

## 2017-02-03 NOTE — Assessment & Plan Note (Signed)
Physical exam completed. Advised on diet and exercise and given dietary instructions. Mammogram and Pap smear up-to-date. Patient is interested in smoking cessation. Discussed potential options and we opted for Wellbutrin. Instructions to taper on to this were given in the AVS. Lab work previously done. Cholesterol improved from prior. She is prediabetic. Discussed diet and exercise for these things. WBC minimally elevated. We will recheck that today.

## 2017-02-06 ENCOUNTER — Telehealth: Payer: Self-pay

## 2017-02-06 DIAGNOSIS — D582 Other hemoglobinopathies: Secondary | ICD-10-CM

## 2017-02-06 NOTE — Telephone Encounter (Signed)
-----   Message from Glori LuisEric G Sonnenberg, MD sent at 02/06/2017  9:13 AM EDT ----- Please let the patient know her hemoglobin is minimally elevated. Her white blood cell count is back in the normal range. I would like to recheck this in several weeks to ensure that hemoglobin is back in the normal range. Thanks.

## 2017-02-06 NOTE — Telephone Encounter (Signed)
Left message to return call 

## 2017-02-07 NOTE — Telephone Encounter (Signed)
Patient notified and scheduled for lab, please place order 

## 2017-02-07 NOTE — Telephone Encounter (Signed)
Pease call pt at [phone]

## 2017-02-07 NOTE — Telephone Encounter (Signed)
Order placed

## 2017-02-23 ENCOUNTER — Telehealth: Payer: Self-pay | Admitting: Family Medicine

## 2017-02-23 DIAGNOSIS — R062 Wheezing: Secondary | ICD-10-CM

## 2017-02-23 NOTE — Telephone Encounter (Signed)
Please advise 

## 2017-02-23 NOTE — Telephone Encounter (Signed)
I need another PFT order stating the comment METH CHALLENGE. Thank you!

## 2017-02-24 ENCOUNTER — Other Ambulatory Visit: Payer: 59

## 2017-02-24 NOTE — Telephone Encounter (Signed)
Order placed

## 2017-02-27 NOTE — Telephone Encounter (Signed)
fyi

## 2017-02-27 NOTE — Telephone Encounter (Signed)
Please advise 

## 2017-02-27 NOTE — Telephone Encounter (Signed)
This was previously ordered. I will place another order.

## 2017-02-27 NOTE — Telephone Encounter (Signed)
Thank you yes it was but it has to read Meth Challenge. Thank you so much!

## 2017-02-27 NOTE — Telephone Encounter (Signed)
I need another PFT order stating the Meth Challenge. Please and thank you!

## 2017-03-02 ENCOUNTER — Other Ambulatory Visit: Payer: Self-pay | Admitting: Family Medicine

## 2017-03-03 ENCOUNTER — Other Ambulatory Visit: Payer: Self-pay | Admitting: Family Medicine

## 2017-03-03 DIAGNOSIS — R062 Wheezing: Secondary | ICD-10-CM

## 2017-03-06 ENCOUNTER — Other Ambulatory Visit: Payer: Self-pay | Admitting: Family Medicine

## 2017-03-06 DIAGNOSIS — R062 Wheezing: Secondary | ICD-10-CM

## 2017-03-16 ENCOUNTER — Other Ambulatory Visit: Payer: Self-pay | Admitting: Family Medicine

## 2017-03-16 DIAGNOSIS — R062 Wheezing: Secondary | ICD-10-CM

## 2017-04-05 ENCOUNTER — Other Ambulatory Visit: Payer: Self-pay | Admitting: Family Medicine

## 2017-04-11 ENCOUNTER — Ambulatory Visit: Payer: 59

## 2018-02-12 ENCOUNTER — Telehealth: Payer: Self-pay | Admitting: *Deleted

## 2018-02-12 NOTE — Telephone Encounter (Signed)
Error

## 2018-05-25 ENCOUNTER — Telehealth: Payer: Self-pay | Admitting: Family Medicine

## 2018-05-25 DIAGNOSIS — Z1329 Encounter for screening for other suspected endocrine disorder: Secondary | ICD-10-CM

## 2018-05-25 DIAGNOSIS — Z13220 Encounter for screening for lipoid disorders: Secondary | ICD-10-CM

## 2018-05-25 DIAGNOSIS — Z6832 Body mass index (BMI) 32.0-32.9, adult: Secondary | ICD-10-CM

## 2018-05-25 DIAGNOSIS — E6609 Other obesity due to excess calories: Secondary | ICD-10-CM

## 2018-05-25 DIAGNOSIS — Z130 Encounter for screening for diseases of the blood and blood-forming organs and certain disorders involving the immune mechanism: Secondary | ICD-10-CM

## 2018-05-25 NOTE — Telephone Encounter (Signed)
Ordered to be done in advance of CPE.

## 2018-05-25 NOTE — Telephone Encounter (Signed)
Copied from CRM [phone]. Topic: Quick Communication - See Telephone Encounter >> May 25, 2018 11:59 AM Luanna Coleawoud, Jessica L wrote: CRM for notification. See Telephone encounter for: 05/25/18. Pt called and stated that she would like to schedule lab work because she can not get into the office until January for a physical. Pt would like to make sure that everything is okay. Please advise [phone]

## 2018-05-25 NOTE — Telephone Encounter (Signed)
Sent to PCP to place orders  

## 2018-05-25 NOTE — Telephone Encounter (Signed)
Request to have labs done but will not have a physical until [place]

## 2018-05-28 NOTE — Telephone Encounter (Signed)
Called pt and left a detailed VM to call back to schedule for her lab work to be done before her CPE. Will try and call pt back again later.

## 2018-05-29 NOTE — Telephone Encounter (Signed)
Called pt and left a detailed VM advised pt to be fasting for lab and that she could call back to schedule her lab appt.

## 2018-06-01 ENCOUNTER — Other Ambulatory Visit: Payer: Self-pay | Admitting: Adult Health

## 2018-06-01 DIAGNOSIS — Z1231 Encounter for screening mammogram for malignant neoplasm of breast: Secondary | ICD-10-CM

## 2018-06-08 ENCOUNTER — Ambulatory Visit (HOSPITAL_COMMUNITY)
Admission: RE | Admit: 2018-06-08 | Discharge: 2018-06-08 | Disposition: A | Discharge status: Home / Self Care | Payer: BLUE CROSS/BLUE SHIELD | Source: Ambulatory Visit | Attending: Adult Health | Admitting: Adult Health

## 2018-06-08 ENCOUNTER — Encounter (HOSPITAL_COMMUNITY): Payer: Self-pay

## 2018-06-08 DIAGNOSIS — Z1231 Encounter for screening mammogram for malignant neoplasm of breast: Secondary | ICD-10-CM | POA: Insufficient documentation

## 2018-06-11 ENCOUNTER — Other Ambulatory Visit: Payer: Self-pay | Admitting: Adult Health

## 2018-06-11 DIAGNOSIS — R928 Other abnormal and inconclusive findings on diagnostic imaging of breast: Secondary | ICD-10-CM

## 2018-06-12 ENCOUNTER — Telehealth: Payer: Self-pay | Admitting: Adult Health

## 2018-06-12 DIAGNOSIS — R928 Other abnormal and inconclusive findings on diagnostic imaging of breast: Secondary | ICD-10-CM

## 2018-06-12 NOTE — Telephone Encounter (Signed)
Pt aware that mammogram and [place] 10/22 at 1:30 pm at [facility]

## 2018-06-19 ENCOUNTER — Encounter (HOSPITAL_COMMUNITY): Payer: BLUE CROSS/BLUE SHIELD

## 2018-06-22 ENCOUNTER — Other Ambulatory Visit: Payer: BLUE CROSS/BLUE SHIELD

## 2018-06-22 ENCOUNTER — Other Ambulatory Visit (INDEPENDENT_AMBULATORY_CARE_PROVIDER_SITE_OTHER): Payer: BLUE CROSS/BLUE SHIELD

## 2018-06-22 DIAGNOSIS — Z1329 Encounter for screening for other suspected endocrine disorder: Secondary | ICD-10-CM

## 2018-06-22 DIAGNOSIS — Z130 Encounter for screening for diseases of the blood and blood-forming organs and certain disorders involving the immune mechanism: Secondary | ICD-10-CM

## 2018-06-22 DIAGNOSIS — Z13220 Encounter for screening for lipoid disorders: Secondary | ICD-10-CM | POA: Diagnosis not present

## 2018-06-22 DIAGNOSIS — Z6832 Body mass index (BMI) 32.0-32.9, adult: Secondary | ICD-10-CM

## 2018-06-22 DIAGNOSIS — E6609 Other obesity due to excess calories: Secondary | ICD-10-CM

## 2018-06-23 LAB — COMPREHENSIVE METABOLIC PANEL
AG RATIO: 1.8 (calc) (ref 1.0–2.5)
ALBUMIN MSPROF: 4.4 g/dL (ref 3.6–5.1)
ALT: 18 U/L (ref 6–29)
AST: 17 U/L (ref 10–35)
Alkaline phosphatase (APISO): 58 U/L (ref 33–115)
BUN: 13 mg/dL (ref 7–25)
CHLORIDE: 106 mmol/L (ref 98–110)
CO2: 24 mmol/L (ref 20–32)
CREATININE: 0.62 mg/dL (ref 0.50–1.10)
Calcium: 9.4 mg/dL (ref 8.6–10.2)
GLOBULIN: 2.4 g/dL (ref 1.9–3.7)
GLUCOSE: 85 mg/dL (ref 65–99)
POTASSIUM: 4.6 mmol/L (ref 3.5–5.3)
Sodium: 140 mmol/L (ref 135–146)
Total Bilirubin: 0.5 mg/dL (ref 0.2–1.2)
Total Protein: 6.8 g/dL (ref 6.1–8.1)

## 2018-06-23 LAB — CBC
HCT: 41.2 % (ref 35.0–45.0)
HEMOGLOBIN: 14.1 g/dL (ref 11.7–15.5)
MCH: 29.5 pg (ref 27.0–33.0)
MCHC: 34.2 g/dL (ref 32.0–36.0)
MCV: 86.2 fL (ref 80.0–100.0)
MPV: 10.4 fL (ref 7.5–12.5)
PLATELETS: 289 10*3/uL (ref 140–400)
RBC: 4.78 10*6/uL (ref 3.80–5.10)
RDW: 13 % (ref 11.0–15.0)
WBC: 9.6 10*3/uL (ref 3.8–10.8)

## 2018-06-23 LAB — LIPID PANEL
Cholesterol: 219 mg/dL — ABNORMAL HIGH (ref <200)
HDL: 53 mg/dL (ref >50)
LDL CHOLESTEROL (CALC): 139 mg/dL — AB
Non-HDL Cholesterol (Calc): 166 mg/dL (calc) — ABNORMAL HIGH (ref <130)
TRIGLYCERIDES: 141 mg/dL (ref <150)
Total CHOL/HDL Ratio: 4.1 (calc) (ref <5.0)

## 2018-06-23 LAB — HEMOGLOBIN A1C
Hgb A1c MFr Bld: 5.5 % of total Hgb (ref <5.7)
Mean Plasma Glucose: 111 (calc)
eAG (mmol/L): 6.2 (calc)

## 2018-06-23 LAB — TSH: TSH: 1.59 mIU/L

## 2018-06-26 ENCOUNTER — Encounter (HOSPITAL_COMMUNITY): Payer: BLUE CROSS/BLUE SHIELD

## 2018-06-26 ENCOUNTER — Ambulatory Visit (HOSPITAL_COMMUNITY): Payer: BLUE CROSS/BLUE SHIELD

## 2018-06-26 ENCOUNTER — Other Ambulatory Visit (HOSPITAL_COMMUNITY): Payer: BLUE CROSS/BLUE SHIELD

## 2018-06-26 ENCOUNTER — Encounter (HOSPITAL_COMMUNITY): Payer: Self-pay

## 2018-06-26 ENCOUNTER — Ambulatory Visit (HOSPITAL_COMMUNITY)
Admission: RE | Admit: 2018-06-26 | Discharge: 2018-06-26 | Disposition: A | Discharge status: Home / Self Care | Payer: BLUE CROSS/BLUE SHIELD | Source: Ambulatory Visit | Attending: Adult Health | Admitting: Adult Health

## 2018-06-26 DIAGNOSIS — R928 Other abnormal and inconclusive findings on diagnostic imaging of breast: Secondary | ICD-10-CM

## 2018-08-30 ENCOUNTER — Other Ambulatory Visit: Payer: Self-pay | Admitting: Family Medicine

## 2018-09-04 ENCOUNTER — Ambulatory Visit: Payer: BLUE CROSS/BLUE SHIELD | Admitting: Internal Medicine

## 2018-09-04 ENCOUNTER — Encounter: Payer: Self-pay | Admitting: Internal Medicine

## 2018-09-04 VITALS — BP 124/80 | HR 73 | Temp 98.1°F | Wt 182.0 lb

## 2018-09-04 DIAGNOSIS — L255 Unspecified contact dermatitis due to plants, except food: Secondary | ICD-10-CM

## 2018-09-04 MED ORDER — PREDNISONE 10 MG PO TABS: ORAL_TABLET | ORAL | 0 refills | Status: DC

## 2018-09-04 MED ORDER — METHYLPREDNISOLONE ACETATE 80 MG/ML IJ SUSP
80.0000 mg | Freq: Once | INTRAMUSCULAR | Status: AC
  Administered 2018-09-04: 80 mg via INTRAMUSCULAR

## 2018-09-04 NOTE — Patient Instructions (Signed)
Contact Dermatitis  Dermatitis is redness, soreness, and swelling (inflammation) of the skin. Contact dermatitis is a reaction to something that touches the skin.  There are two types of contact dermatitis:   Irritant contact dermatitis. This happens when something bothers (irritates) your skin, like soap.   Allergic contact dermatitis. This is caused when you are exposed to something that you are allergic to, such as poison ivy.  What are the causes?   Common causes of irritant contact dermatitis include:  ? Makeup.  ? Soaps.  ? Detergents.  ? Bleaches.  ? Acids.  ? Metals, such as nickel.   Common causes of allergic contact dermatitis include:  ? Plants.  ? Chemicals.  ? Jewelry.  ? Latex.  ? Medicines.  ? Preservatives in products, such as clothing.  What increases the risk?   Having a job that exposes you to things that bother your skin.   Having asthma or eczema.  What are the signs or symptoms?  Symptoms may happen anywhere the irritant has touched your skin. Symptoms include:   Dry or flaky skin.   Redness.   Cracks.   Itching.   Pain or a burning feeling.   Blisters.   Blood or clear fluid draining from skin cracks.  With allergic contact dermatitis, swelling may occur. This may happen in places such as the eyelids, mouth, or genitals.  How is this treated?   This condition is treated by checking for the cause of the reaction and protecting your skin. Treatment may also include:  ? Steroid creams, ointments, or medicines.  ? Antibiotic medicines or other ointments, if you have a skin infection.  ? Lotion or medicines to help with itching.  ? A bandage (dressing).  Follow these instructions at home:  Skin care   Moisturize your skin as needed.   Put cool cloths on your skin.   Put a baking soda paste on your skin. Stir water into baking soda until it looks like a paste.   Do not scratch your skin.   Avoid having things rub up against your skin.   Avoid the use of soaps, perfumes, and  dyes.  Medicines   Take or apply over-the-counter and prescription medicines only as told by your doctor.   If you were prescribed an antibiotic medicine, take or apply it as told by your doctor. Do not stop using it even if your condition starts to get better.  Bathing   Take a bath with:  ? Epsom salts.  ? Baking soda.  ? Colloidal oatmeal.   Bathe less often.   Bathe in warm water. Avoid using hot water.  Bandage care   If you were given a bandage, change it as told by your health care provider.   Wash your hands with soap and water before and after you change your bandage. If soap and water are not available, use hand sanitizer.  General instructions   Avoid the things that caused your reaction. If you do not know what caused it, keep a journal. Write down:  ? What you eat.  ? What skin products you use.  ? What you drink.  ? What you wear in the area that has symptoms. This includes jewelry.   Check the affected areas every day for signs of infection. Check for:  ? More redness, swelling, or pain.  ? More fluid or blood.  ? Warmth.  ? Pus or a bad smell.   Keep all follow-up visits as   told by your doctor. This is important.  Contact a doctor if:   You do not get better with treatment.   Your condition gets worse.   You have signs of infection, such as:  ? More swelling.  ? Tenderness.  ? More redness.  ? Soreness.  ? Warmth.   You have a fever.   You have new symptoms.  Get help right away if:   You have a very bad headache.   You have neck pain.   Your neck is stiff.   You throw up (vomit).   You feel very sleepy.   You see red streaks coming from the area.   Your bone or joint near the area hurts after the skin has healed.   The area turns darker.   You have trouble breathing.  Summary   Dermatitis is redness, soreness, and swelling of the skin.   Symptoms may occur where the irritant has touched you.   Treatment may include medicines and skin care.   If you do not know what caused  your reaction, keep a journal.   Contact a doctor if your condition gets worse or you have signs of infection.  This information is not intended to replace advice given to you by your health care provider. Make sure you discuss any questions you have with your health care provider.  Document Released: 06/19/2009 Document Revised: 03/07/2018 Document Reviewed: 03/07/2018  Elsevier Interactive Patient Education  2019 Elsevier Inc.

## 2018-09-04 NOTE — Progress Notes (Signed)
Subjective:    Patient ID: Bonnie Murphy, female    DOB: 04/04/1971, 47 y.o.   MRN: [id number]  HPI  Pt presents to the clinic today with c/o a rash. She noticed this yesterday. The rash itches and burns. It is located on her arms, right side of her neck and in her bilateral ears. She was in the [place] over the [place]. She thinks she came in contact with some poison ivy. She has tried Calamine lotion with minimal relief.  Review of Systems  Past Medical History:  Diagnosis Date  . Asthma    seasonal  . Cyst of right breast   . IUD (intrauterine device) in place 11/21/2014  . OAB (overactive bladder) 11/21/2014  . Wheezing 11/15/2013   Smokes, dad died with lung cancer get chest xray    Current Outpatient Medications  Medication Sig Dispense Refill  . buPROPion (WELLBUTRIN SR) 150 MG 12 hr tablet Take 1 tablet (150 mg total) by mouth 2 (two) times daily. 60 tablet 2  . levonorgestrel (MIRENA) 20 MCG/24HR IUD 1 each by Intrauterine route once.    Marland Kitchen. PROAIR HFA 108 (90 Base) MCG/ACT inhaler INHALE 2 PUFFS BY MOUTH EVERY 6 HOURS AS NEEDED FOR WHEEZING FOR SHORTNESS OF BREATH 9 g 0   No current facility-administered medications for this visit.     No Known Allergies  Family History  Problem Relation Age of Onset  . Diabetes Mother   . Cancer Father        lung  . Cirrhosis Father   . Cancer Paternal Aunt        lung  . Cancer Paternal Uncle        lung  . Cirrhosis Paternal Grandmother     Social History   Socioeconomic History  . Marital status: Married    Spouse name: Not on file  . Number of children: Not on file  . Years of education: Not on file  . Highest education level: Not on file  Occupational History  . Not on file  Social Needs  . Financial resource strain: Not on file  . Food insecurity:    Worry: Not on file    Inability: Not on file  . Transportation needs:    Medical: Not on file    Non-medical: Not on file  Tobacco Use  . Smoking status:  Current Every Day Smoker    Packs/day: 1.00    Years: 31.00    Pack years: 31.00    Types: Cigarettes  . Smokeless tobacco: Never Used  Substance and Sexual Activity  . Alcohol use: Yes    Alcohol/week: 1.0 standard drinks    Types: 1 Standard drinks or equivalent per week    Comment: socially  . Drug use: No  . Sexual activity: Yes    Partners: Male    Birth control/protection: I.U.D.    Comment: Husband   Lifestyle  . Physical activity:    Days per week: Not on file    Minutes per session: Not on file  . Stress: Not on file  Relationships  . Social connections:    Talks on phone: Not on file    Gets together: Not on file    Attends religious service: Not on file    Active member of club or organization: Not on file    Attends meetings of clubs or organizations: Not on file    Relationship status: Not on file  . Intimate partner violence:  Fear of current or ex partner: Not on file    Emotionally abused: Not on file    Physically abused: Not on file    Forced sexual activity: Not on file  Other Topics Concern  . 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  Skin: Pt reports rash.     No other specific complaints in a complete review of systems (except as listed in HPI above).     Objective:   Physical Exam  BP 124/80   Pulse 73   Temp 98.1 F (36.7 C) (Oral)   Wt 182 lb (82.6 kg)   SpO2 97%   BMI 33.29 kg/m  Wt Readings from Last 3 Encounters:  09/04/18 182 lb (82.6 kg)  02/03/17 176 lb (79.8 kg)  12/26/16 175 lb 8 oz (79.6 kg)    General: Appears her stated age, well developed, well nourished in NAD. Skin: Grouped vesicular lesions on erythematous base noted on bilateral arms,  right side of neck and in ears.  BMET    Component Value Date/Time   NA 140 06/22/2018 1500   NA 140 11/21/2014 0910   K 4.6 06/22/2018 1500   CL 106 06/22/2018 1500   CO2 24 06/22/2018 1500   GLUCOSE 85 06/22/2018 1500   BUN 13 06/22/2018 1500   BUN 8 11/21/2014 0910   CREATININE 0.62 06/22/2018 1500   CALCIUM 9.4 06/22/2018 1500   GFRNONAA 109 11/21/2014 0910   GFRAA 126 11/21/2014 0910    Lipid Panel     Component Value Date/Time   CHOL 219 (H) 06/22/2018 1500   CHOL 180 11/21/2014 0910   TRIG 141 06/22/2018 1500   HDL 53 06/22/2018 1500   HDL 43 11/21/2014 0910   CHOLHDL 4.1 06/22/2018 1500   VLDL 24.8 01/27/2017 0912   LDLCALC 139 (H) 06/22/2018 1500    CBC    Component Value Date/Time   WBC 9.6 06/22/2018 1500   RBC 4.78 06/22/2018 1500   HGB 14.1 06/22/2018 1500   HCT 41.2 06/22/2018 1500   PLT 289 06/22/2018 1500   MCV 86.2 06/22/2018 1500   MCH 29.5 06/22/2018 1500   MCHC 34.2 06/22/2018 1500   RDW 13.0 06/22/2018 1500   RDW 13.6 11/21/2014 0910   LYMPHSABS 3.7 01/12/2016 1524   MONOABS 0.4 01/12/2016 1524   EOSABS 0.1 01/12/2016 1524   BASOSABS 0.0 01/12/2016 1524    Hgb A1C Lab Results  Component Value Date   HGBA1C 5.5 06/22/2018            Assessment & Plan:   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

## 2018-09-14 ENCOUNTER — Ambulatory Visit (INDEPENDENT_AMBULATORY_CARE_PROVIDER_SITE_OTHER): Payer: BLUE CROSS/BLUE SHIELD | Admitting: Family Medicine

## 2018-09-14 ENCOUNTER — Encounter: Payer: Self-pay | Admitting: Family Medicine

## 2018-09-14 DIAGNOSIS — Z0001 Encounter for general adult medical examination with abnormal findings: Secondary | ICD-10-CM

## 2018-09-14 DIAGNOSIS — Z0000 Encounter for general adult medical examination without abnormal findings: Secondary | ICD-10-CM

## 2018-09-14 NOTE — Assessment & Plan Note (Signed)
Physical exam completed.  Discussed started exercise several days a week and try to decrease her sweet intake several days a week at lunch to start with and building her way up from there.  I advised that I would not consider medication for weight loss until she had tried diet and exercise.  She will see gynecology as planned in June.  She will start back on Wellbutrin 1 tablet daily for 3 days and then 1 tablet twice daily for smoking cessation.  If she has any side effects she will let us know.  Could consider having her see our clinical pharmacist for smoking cessation counseling and treatment if she is unable to tolerate Wellbutrin.  Discussed having her see ophthalmology.  Lab work previously completed.  This has been reviewed.

## 2018-09-14 NOTE — Patient Instructions (Signed)
Nice to see you. Please try to start walking for exercise 3 days a week. Please try to cut down on your sweet intake at lunch 3 days a week.  Once you are able to do this please build up from there with less days with sweets at lunch. Please keep your appointment with GYN. You can try the Wellbutrin again.  Please take 1 tablet once daily for 3 days and then increase to 1 tablet twice daily.  If you have any issues when starting this or while taking this please let us know.

## 2018-09-14 NOTE — Progress Notes (Signed)
Marikay AlarEric Sonnenberg, MD Phone: [phone]  Bonnie Lollaula W Murphy is a 48 y.o. female who presents today for CPE.  Exercise: She has not been exercising though she plans to start with walking.  Also riding a bike. Diet is described as bad.  She eats lots of sweets.  She will have oatmeal for breakfast and then she does not eat lunch though will have cookies and chips while at work.  She will have ice cream after dinner.  She has sweets every day.  She is interested in weight loss medication. Pap smear up-to-date 12/26/2016 through gynecology.  She does not have menstrual cycles given that she has an IUD.  She follows up with gynecology for Pap smear and breast exam in June. Mammogram up-to-date.  She ended up having a diagnostic mammogram that revealed a benign cyst. Tetanus vaccination up-to-date.  She declines flu vaccination. HIV screening up-to-date. Patient reports she did well on Wellbutrin initially.  She got down to 3 to 5 cigarettes a day.  She had a couple of episodes of dizziness and discontinued Wellbutrin as she thought it was related to that.  She is now back to 1 pack/day.  The dizziness has not recurred.  She is interested in trying the Wellbutrin again.  She has no seizure history. No alcohol use or illicit drug use. She sees a Education officer, communitydentist twice yearly. She does not see an ophthalmologist.  She has had to start using reading glasses.  Active Ambulatory Problems    Diagnosis Date Noted  . Wheezing 11/15/2013  . Tobacco use disorder 11/17/2014  . IUD (intrauterine device) in place 11/21/2014  . OAB (overactive bladder) 11/21/2014  . Obese 06/05/2015  . Encounter for general adult medical examination with abnormal findings 01/12/2016   Resolved Ambulatory Problems    Diagnosis Date Noted  . Encounter to establish care 11/17/2014   Past Medical History:  Diagnosis Date  . Asthma   . Cyst of right breast     Family History  Problem Relation Age of Onset  . Diabetes Mother   .  Cancer Father        lung  . Cirrhosis Father   . Cancer Paternal Aunt        lung  . Cancer Paternal Uncle        lung  . Cirrhosis Paternal Grandmother     Social History   Socioeconomic History  . Marital status: Married    Spouse name: Not on file  . Number of children: Not on file  . Years of education: Not on file  . Highest education level: Not on file  Occupational History  . Not on file  Social Needs  . Financial resource strain: Not on file  . Food insecurity:    Worry: Not on file    Inability: Not on file  . Transportation needs:    Medical: Not on file    Non-medical: Not on file  Tobacco Use  . Smoking status: Current Every Day Smoker    Packs/day: 1.00    Years: 31.00    Pack years: 31.00    Types: Cigarettes  . Smokeless tobacco: Never Used  Substance and Sexual Activity  . Alcohol use: Yes    Alcohol/week: 1.0 standard drinks    Types: 1 Standard drinks or equivalent per week    Comment: socially  . Drug use: No  . Sexual activity: Yes    Partners: Male    Birth control/protection: I.U.D.    Comment: Husband  Lifestyle  . Physical activity:    Days per week: Not on file    Minutes per session: Not on file  . Stress: Not on file  Relationships  . Social connections:    Talks on phone: Not on file    Gets together: Not on file    Attends religious service: Not on file    Active member of club or organization: Not on file    Attends meetings of clubs or organizations: Not on file    Relationship status: Not on file  . Intimate partner violence:    Fear of current or ex partner: Not on file    Emotionally abused: Not on file    Physically abused: Not on file    Forced sexual activity: Not on file  Other Topics Concern  . 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 CV:  Negative for chest pain, dyspnea, edema, palpitations Resp: Negative for cough, dyspnea, hemoptysis GI: Negative for nausea, vomiting, diarrhea, constipation, abdominal pain, melena, hematochezia. GU: Negative for dysuria, incontinence, urinary hesitance, hematuria, vaginal or penile discharge, polyuria, sexual difficulty, lumps in testicle or breasts MSK: Negative for muscle cramps or aches, joint pain or swelling Neuro: Negative for headaches, weakness, numbness, dizziness, passing out/fainting Psych: Negative for depression, anxiety, memory problems  Objective  Physical Exam Vitals:   09/14/18 0933  BP: 124/86  Pulse: 72  Temp: 98.1 F (36.7 C)  SpO2: 98%    BP Readings from Last 3 Encounters:  09/14/18 124/86  09/04/18 124/80  02/03/17 116/78   Wt Readings from Last 3 Encounters:  09/14/18 182 lb 12.8 oz (82.9 kg)  09/04/18 182 lb (82.6 kg)  02/03/17 176 lb (79.8 kg)    Physical Exam Constitutional:      General: She is not in acute distress.    Appearance: She is not diaphoretic.  HENT:     Head: Normocephalic and atraumatic.     Mouth/Throat:     Mouth: Mucous membranes are moist.     Pharynx: Oropharynx is clear.  Eyes:     Conjunctiva/sclera: Conjunctivae normal.     Pupils: Pupils are equal, round, and reactive to light.  Cardiovascular:     Rate and Rhythm: Normal rate and regular rhythm.     Heart sounds: Normal heart sounds.  Pulmonary:     Effort: Pulmonary effort is normal.     Breath sounds: Normal breath sounds.  Abdominal:     General: Bowel sounds are normal. There is no distension.     Palpations: Abdomen is soft.     Tenderness: There is no abdominal tenderness. There is no  guarding or rebound.  Musculoskeletal:     Right lower leg: No edema.     Left lower leg: No edema.  Lymphadenopathy:     Cervical: No cervical adenopathy.  Skin:    General: Skin is warm and dry.  Neurological:     Mental Status: She is alert.  Psychiatric:  Mood and Affect: Mood normal.      Assessment/Plan:   Encounter for general adult medical examination with abnormal findings Physical exam completed.  Discussed started exercise several days a week and try to decrease her sweet intake several days a week at lunch to start with and building her way up from there.  I advised that I would not consider medication for weight loss until she had tried diet and exercise.  She will see gynecology as planned in June.  She will start back on Wellbutrin 1 tablet daily for 3 days and then 1 tablet twice daily for smoking cessation.  If she has any side effects she will let us know.  Could consider having her see our clinical pharmacist for smoking cessation counseling and treatment if she is unable to tolerate Wellbutrin.  Discussed having her see ophthalmology.  Lab work previously completed.  This has been reviewed.   No orders of the defined types were placed in this encounter.   No orders of the defined types were placed in this encounter.    Marikay Alar, MD [facility]

## 2018-11-15 ENCOUNTER — Other Ambulatory Visit: Payer: Self-pay | Admitting: Family Medicine

## 2018-11-26 ENCOUNTER — Other Ambulatory Visit: Payer: Self-pay

## 2018-11-26 MED ORDER — PROAIR HFA 108 (90 BASE) MCG/ACT IN AERS: INHALATION_SPRAY | RESPIRATORY_TRACT | 1 refills | Status: DC

## 2018-12-07 ENCOUNTER — Telehealth: Payer: Self-pay

## 2018-12-07 MED ORDER — ALBUTEROL SULFATE HFA 108 (90 BASE) MCG/ACT IN AERS: 2.0000 | INHALATION_SPRAY | Freq: Four times a day (QID) | RESPIRATORY_TRACT | 0 refills | Status: DC | PRN

## 2018-12-07 NOTE — Telephone Encounter (Signed)
Sent to pharmacy 

## 2018-12-07 NOTE — Telephone Encounter (Signed)
Refill request for medication change switching to generic   Albuterol HFA inhaler 8.5 gm 90 mcg   Pt will save 288 dollar by switching   Sent to PCP to advise

## 2018-12-21 ENCOUNTER — Ambulatory Visit: Payer: BLUE CROSS/BLUE SHIELD | Admitting: Family Medicine

## 2019-03-06 ENCOUNTER — Ambulatory Visit (INDEPENDENT_AMBULATORY_CARE_PROVIDER_SITE_OTHER): Payer: BC Managed Care – PPO | Admitting: Advanced Practice Midwife

## 2019-03-06 ENCOUNTER — Encounter: Payer: Self-pay | Admitting: Advanced Practice Midwife

## 2019-03-06 ENCOUNTER — Other Ambulatory Visit: Payer: Self-pay

## 2019-03-06 VITALS — BP 134/86 | HR 72 | Ht 62.0 in | Wt 178.0 lb

## 2019-03-06 DIAGNOSIS — Z3202 Encounter for pregnancy test, result negative: Secondary | ICD-10-CM

## 2019-03-06 DIAGNOSIS — Z30433 Encounter for removal and reinsertion of intrauterine contraceptive device: Secondary | ICD-10-CM | POA: Diagnosis not present

## 2019-03-06 MED ORDER — LEVONORGESTREL 19.5 MCG/DAY IU IUD
INTRAUTERINE_SYSTEM | Freq: Once | INTRAUTERINE | Status: AC
  Administered 2019-03-06: 1 via INTRAUTERINE

## 2019-03-06 NOTE — Progress Notes (Signed)
Bonnie Murphy is a 48 y.o. year old Caucasian female   who presents for removal and replacement of her Mirena IUD. The new IUD is Stacie Acres It has been 5 years since her previous IUD placement. This will be her 4th IUD, never bleeds, loves it!  The risks and benefits of the method and placement have been thouroughly reviewed with the patient and all questions were answered.  Specifically the patient is aware of failure rate of 09/998, expulsion of the IUD and of possible perforation.  The patient is aware of irregular bleeding due to the method and understands the incidence of irregular bleeding diminishes with time.  Time out was performed.  A Graves speculum was placed.  The cervix was prepped using Betadine. The strings were found to be  visible.   They were grasped and the IUD was easily removed. The cervix was then grasped with a tenaculum and the uterus was sounded to 7 cm. The IUD was inserted to 7 cm.  It was pulled back 1 cm and the IUD was disengaged. After 15 seconds, the IUD was placed in the fundus.  The strings were trimmed to 3 cm.  Sonogram was performed and the proper placement of the IUD was verified.  The patient was instructed on signs and symptoms of infection and to check for the strings after each menses or each month.  The patient is to refrain from intercourse for 3 days.

## 2019-03-06 NOTE — Addendum Note (Signed)
Addended by: Christiana Pellant A on: 03/06/2019 04:02 PM   Modules accepted: Orders

## 2019-03-06 NOTE — Patient Instructions (Signed)
Nothing in vagina for 3 days (no sex, douching, tampons, etc...) Check your strings once a month to make sure you can feel them, if you are not able to please let us know If you develop a fever of 100.4 or more in the next few weeks, or if you develop severe abdominal pain, please let us know Use a backup method of birth control, such as condoms, for 2 weeks  

## 2019-04-01 ENCOUNTER — Ambulatory Visit: Payer: BC Managed Care – PPO | Admitting: Adult Health

## 2019-04-08 ENCOUNTER — Other Ambulatory Visit: Payer: Self-pay

## 2019-04-08 ENCOUNTER — Encounter: Payer: Self-pay | Admitting: Adult Health

## 2019-04-08 ENCOUNTER — Ambulatory Visit (INDEPENDENT_AMBULATORY_CARE_PROVIDER_SITE_OTHER): Payer: BC Managed Care – PPO | Admitting: Adult Health

## 2019-04-08 VITALS — BP 126/85 | HR 68 | Ht 62.0 in | Wt 181.5 lb

## 2019-04-08 DIAGNOSIS — Z1211 Encounter for screening for malignant neoplasm of colon: Secondary | ICD-10-CM

## 2019-04-08 DIAGNOSIS — Z975 Presence of (intrauterine) contraceptive device: Secondary | ICD-10-CM

## 2019-04-08 DIAGNOSIS — F17200 Nicotine dependence, unspecified, uncomplicated: Secondary | ICD-10-CM

## 2019-04-08 DIAGNOSIS — Z01419 Encounter for gynecological examination (general) (routine) without abnormal findings: Secondary | ICD-10-CM | POA: Diagnosis not present

## 2019-04-08 DIAGNOSIS — Z1212 Encounter for screening for malignant neoplasm of rectum: Secondary | ICD-10-CM

## 2019-04-08 DIAGNOSIS — R062 Wheezing: Secondary | ICD-10-CM

## 2019-04-08 LAB — HEMOCCULT GUIAC POC 1CARD (OFFICE): Fecal Occult Blood, POC: NEGATIVE

## 2019-04-08 MED ORDER — MONTELUKAST SODIUM 10 MG PO TABS: 10.0000 mg | ORAL_TABLET | Freq: Every day | ORAL | 3 refills | Status: DC

## 2019-04-08 NOTE — Progress Notes (Signed)
Patient ID: MEG NIEMEIER, female   DOB: [birth year], 48 y.o.   MRN: [id number] History of Present Illness: Litsy is a 48 year old white female, married, G1P1 in for a well woman gyn exam, she had a normal pap with negative HPV 12/26/16.She had liletta inserted 03/06/2019, this her 4th IUD, and this should get her through menopause, she has no period and loves it.  PCP is Dr Caryl Bis.   Current Medications, Allergies, Past Medical History, Past Surgical History, Family History and Social History were reviewed in [employer] record.     Review of Systems: Patient denies any headaches, hearing loss, fatigue, blurred vision, shortness of breath, chest pain, abdominal pain, problems with bowel movements, urination, or intercourse. No joint pain or mood swings. Occasional wheezing, and requests refill on singulari has used in the past  She tried Wellbutrin to stop smoking and did not like how she felt, so stopped it.    Physical Exam:BP 126/85 (BP Location: Left Arm, Patient Position: Sitting, Cuff Size: Normal)   Pulse 68   Ht 5\' 2"  (1.575 m)   Wt 181 lb 8 oz (82.3 kg)   BMI 33.20 kg/m  General:  Well developed, well nourished, no acute distress Skin:  Warm and dry Neck:  Midline trachea, normal thyroid, good ROM, no lymphadenopathy Lungs: has some expiratory wheezing esp on right but cleared with coughing and lungs clear then  Breast:  No dominant palpable mass, retraction, or nipple discharge Cardiovascular: Regular rate and rhythm Abdomen:  Soft, non tender, no hepatosplenomegaly Pelvic:  External genitalia is normal in appearance, no lesions.  The vagina is normal in appearance. Urethra has no lesions or masses. The cervix is smooth, with +IUD string at os.  Uterus is felt to be normal size, shape, and contour.  No adnexal masses or tenderness noted.Bladder is non tender, no masses felt. Rectal: Good sphincter tone, no polyps, or hemorrhoids felt.  Hemoccult  negative. Extremities/musculoskeletal:  No swelling or varicosities noted, no clubbing or cyanosis Psych:  No mood changes, alert and cooperative,seems happy Fall risk is low PHQ 2 score is 0 Examination chaperoned by Levy Pupa LPN.   Impression: 1. Encounter for well woman exam with routine gynecological exam   2. Screening for colorectal cancer   3. Smoker   4. IUD (intrauterine device) in place   5. Wheezing       Plan: Pap and physical in 1 year Try to stop smoking Will rx Singulair Meds ordered this encounter  Medications  . montelukast (SINGULAIR) 10 MG tablet    Sig: Take 1 tablet (10 mg total) by mouth at bedtime.    Dispense:  30 tablet    Refill:  3    Order Specific Question:   Supervising Provider    Answer:   Florian Buff [[id number]]  Labs with PCP Mammogram yearly Colonoscopy advised at 69

## 2019-05-10 IMAGING — US ULTRASOUND LEFT BREAST LIMITED
1 series · 8 of 8 positions shown · non-contrast
Comparison: Previous exam(s).

CLINICAL DATA: Screening recall for a left breast asymmetry.

EXAM:
DIGITAL DIAGNOSTIC LEFT MAMMOGRAM WITH CAD AND TOMO
ULTRASOUND LEFT BREAST

[Series 1: ultrasound left breast limited · 0.06mm/px · 8 of 8 slices shown]
[im 1/8]
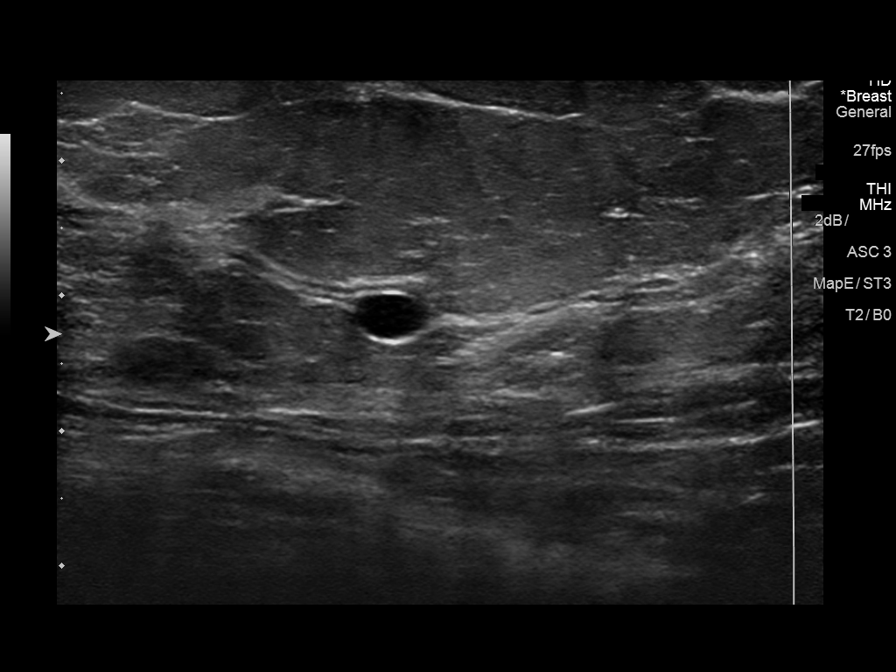
[im 2/8]
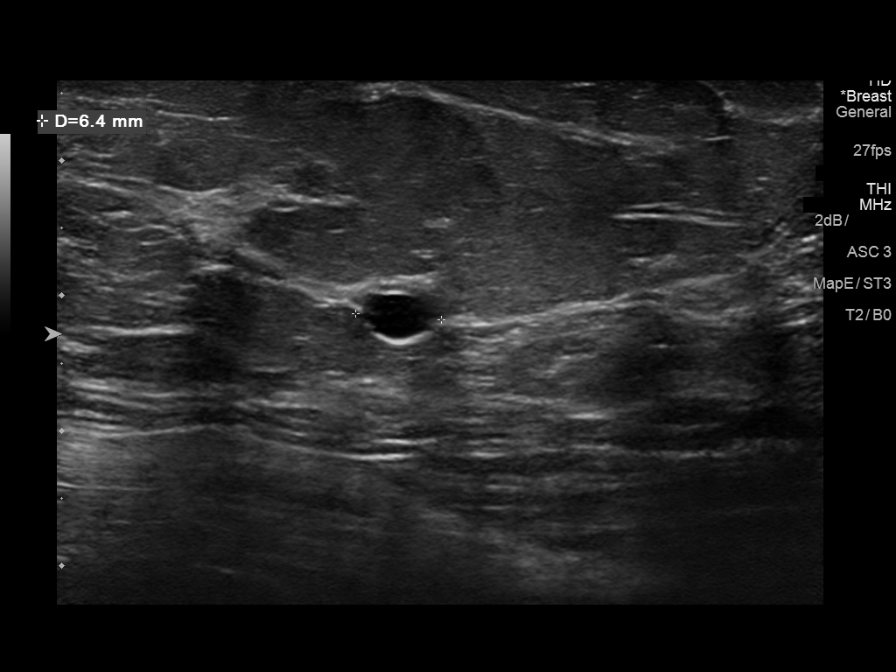
[im 3/8]
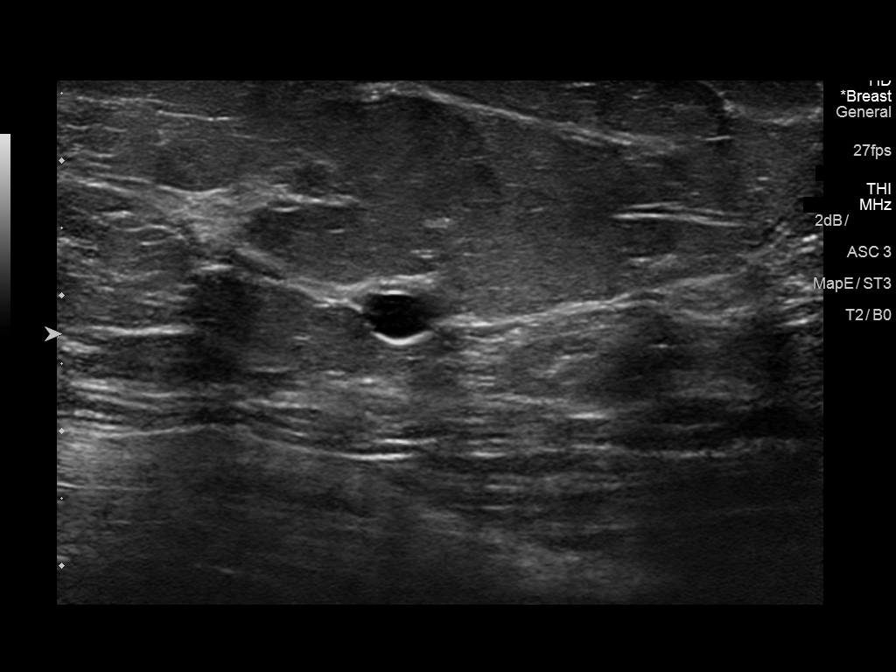
[im 4/8]
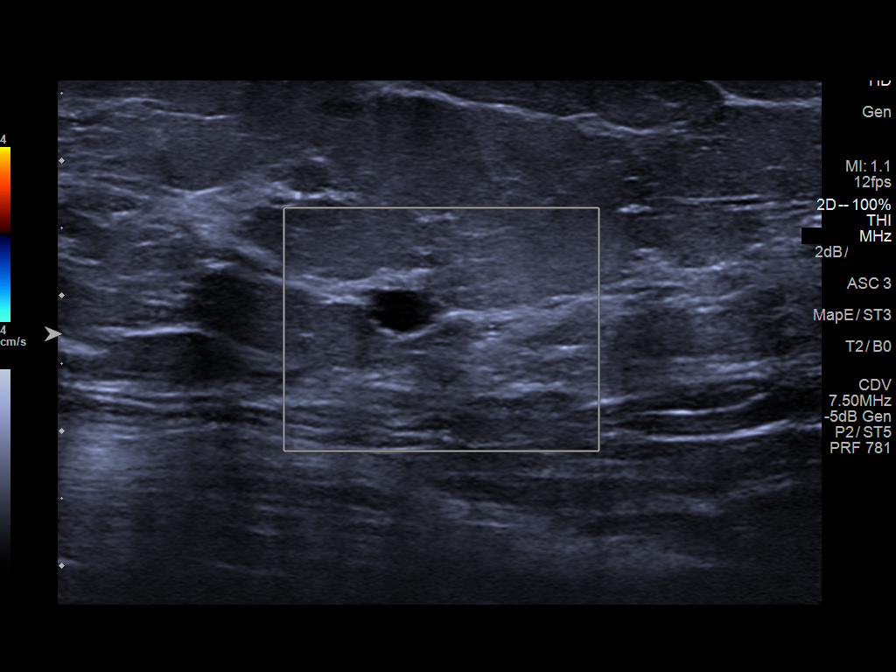
[im 5/8]
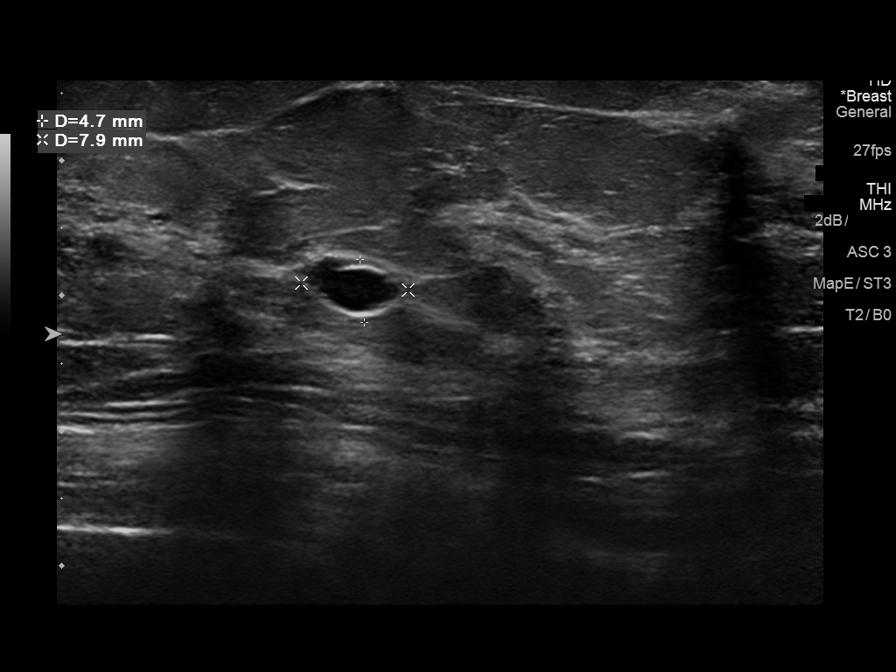
[im 6/8]
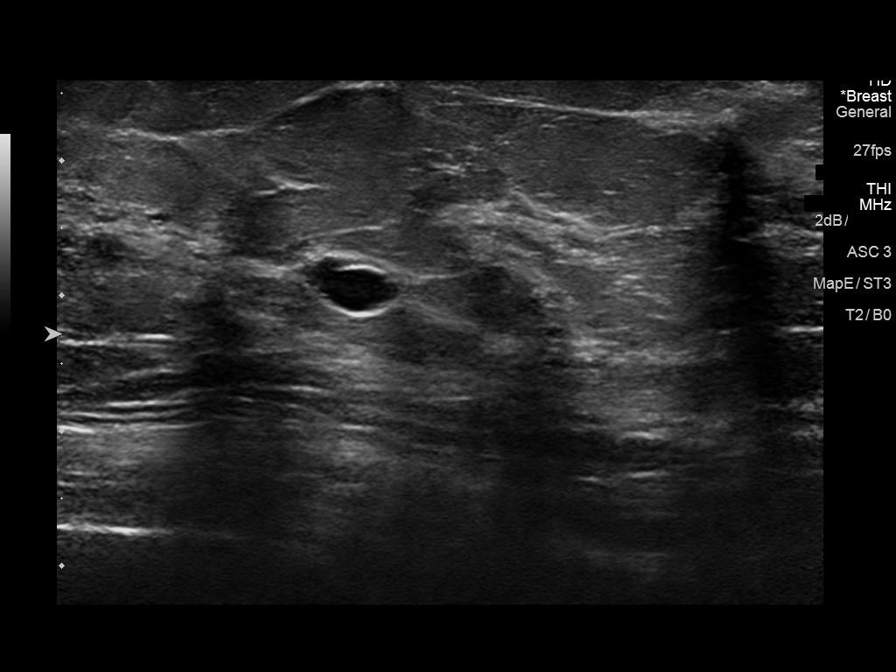
[im 7/8]
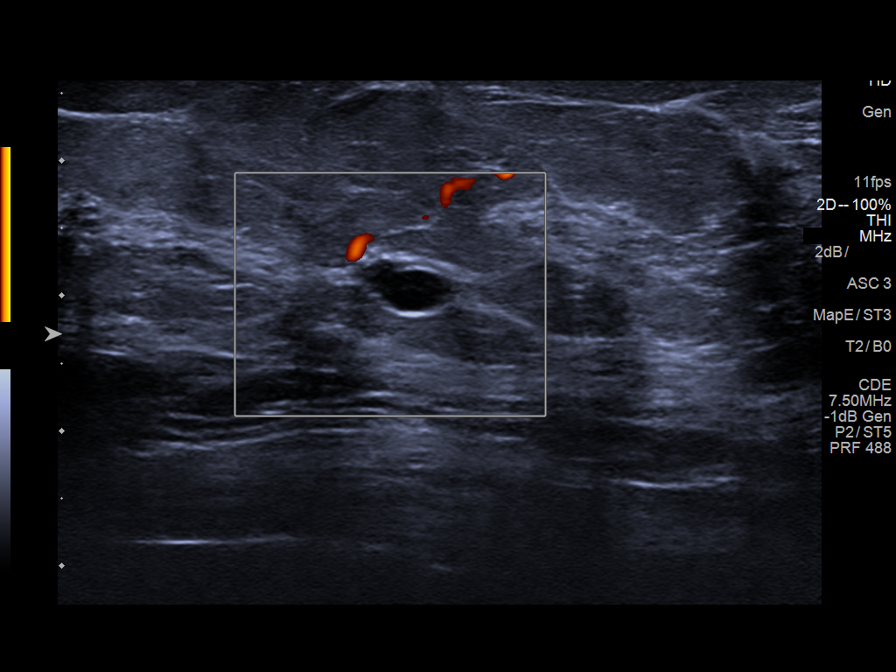
[im 8/8]
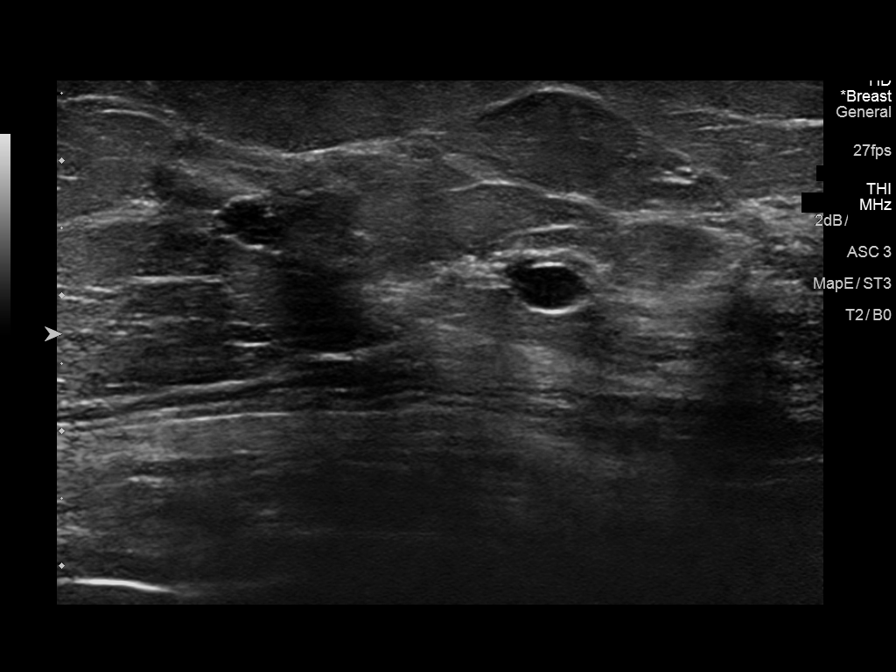

[8 of 8 positions shown; findings below may reference images not displayed]

ACR Breast Density Category c: The breast tissue is heterogeneously
dense, which may obscure small masses.
FINDINGS: The asymmetry seen in the central left breast on the MLO view
persists on the true lateral view, however no corresponding
abnormality is seen on the CC images. This area appears to efface on
the spot compression tomosynthesis images. However, ultrasound will
be performed for further evaluation given the density of the
surrounding breast tissue.

Mammographic images were processed with CAD.

Ultrasound of the left breast at 11 o'clock, 7 cm from the nipple
demonstrates a circumscribed anechoic oval mass measuring 8 x 5 x 6
mm.
IMPRESSION: The left breast mass at 11 o'clock corresponds with a benign cyst.

RECOMMENDATION:
Screening mammogram in one year.(Code:Y5-I-YUS)

I have discussed the findings and recommendations with the patient.
Results were also provided in writing at the conclusion of the
visit. If applicable, a reminder letter will be sent to the patient
regarding the next appointment.

BI-RADS CATEGORY  2: Benign.

## 2019-06-09 IMAGING — MG DIGITAL DIAGNOSTIC UNILATERAL LEFT MAMMOGRAM WITH TOMO AND CAD
2 series · 3 of 6 positions shown · non-contrast
Comparison: Previous exam(s).

CLINICAL DATA: Screening recall for a left breast asymmetry.

EXAM:
DIGITAL DIAGNOSTIC LEFT MAMMOGRAM WITH CAD AND TOMO
ULTRASOUND LEFT BREAST

[L MLO synth-2D]
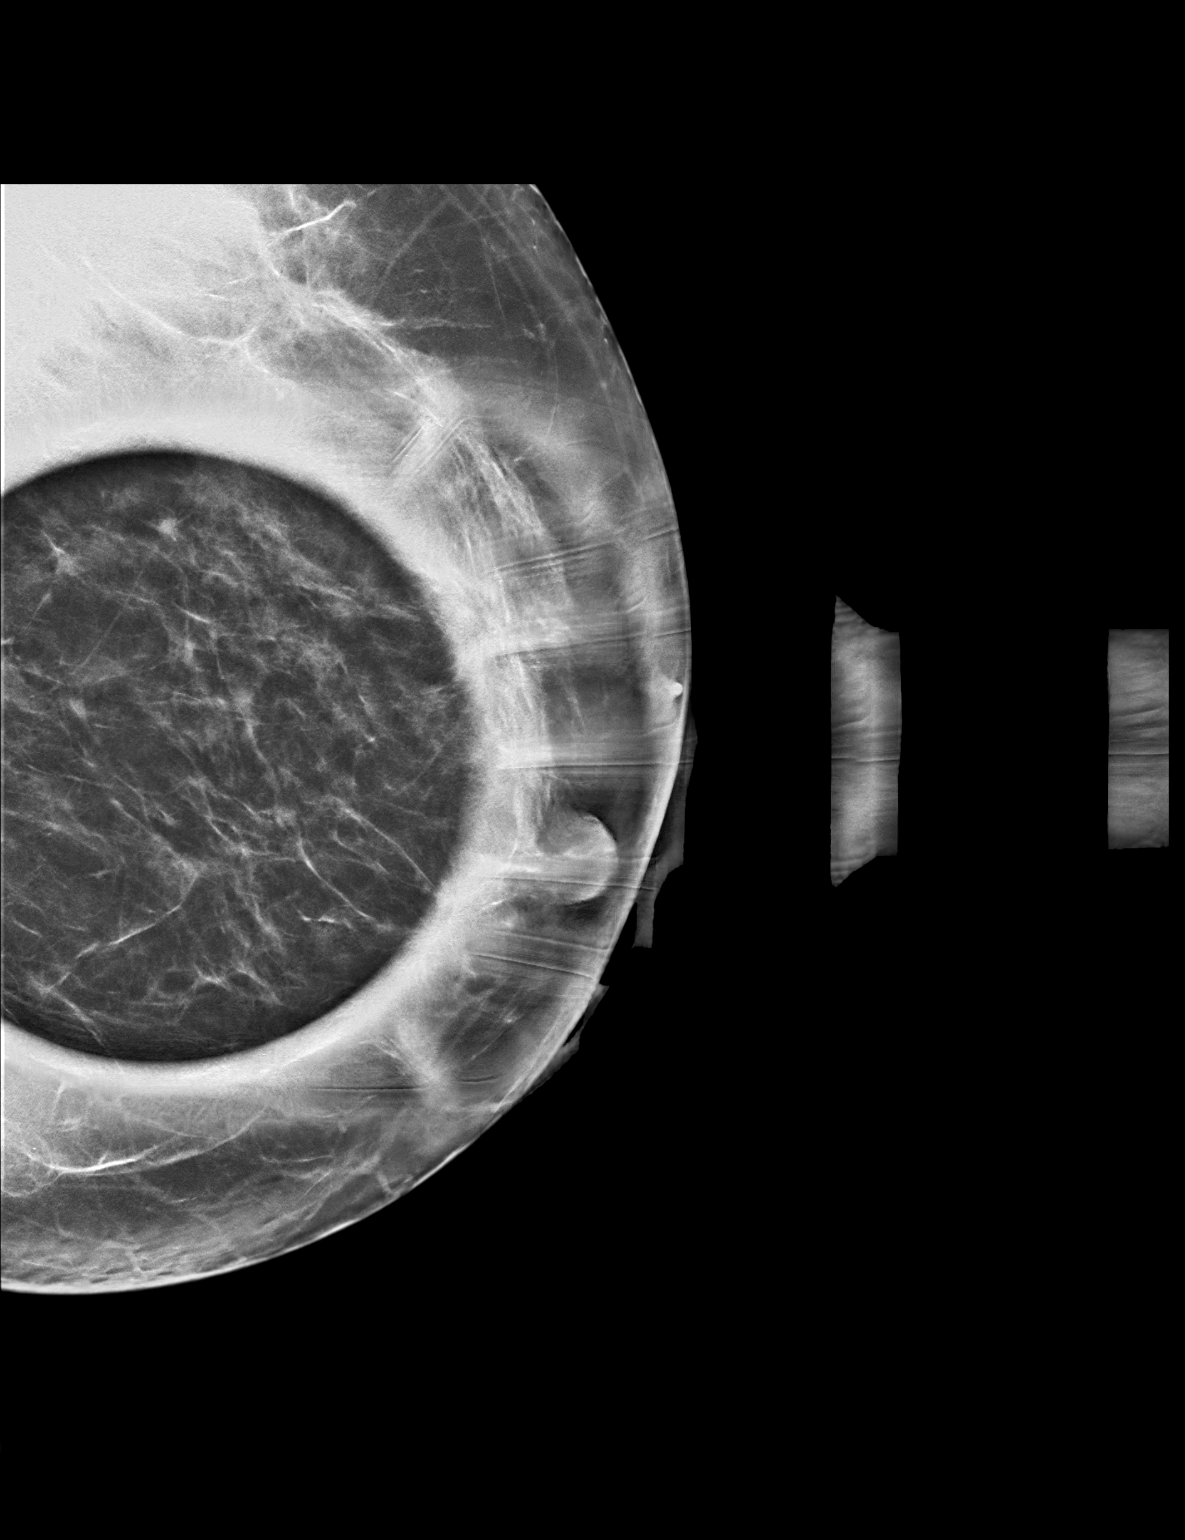

[L CC tomo · 2 of 62 frames shown]
[frame 21/62]
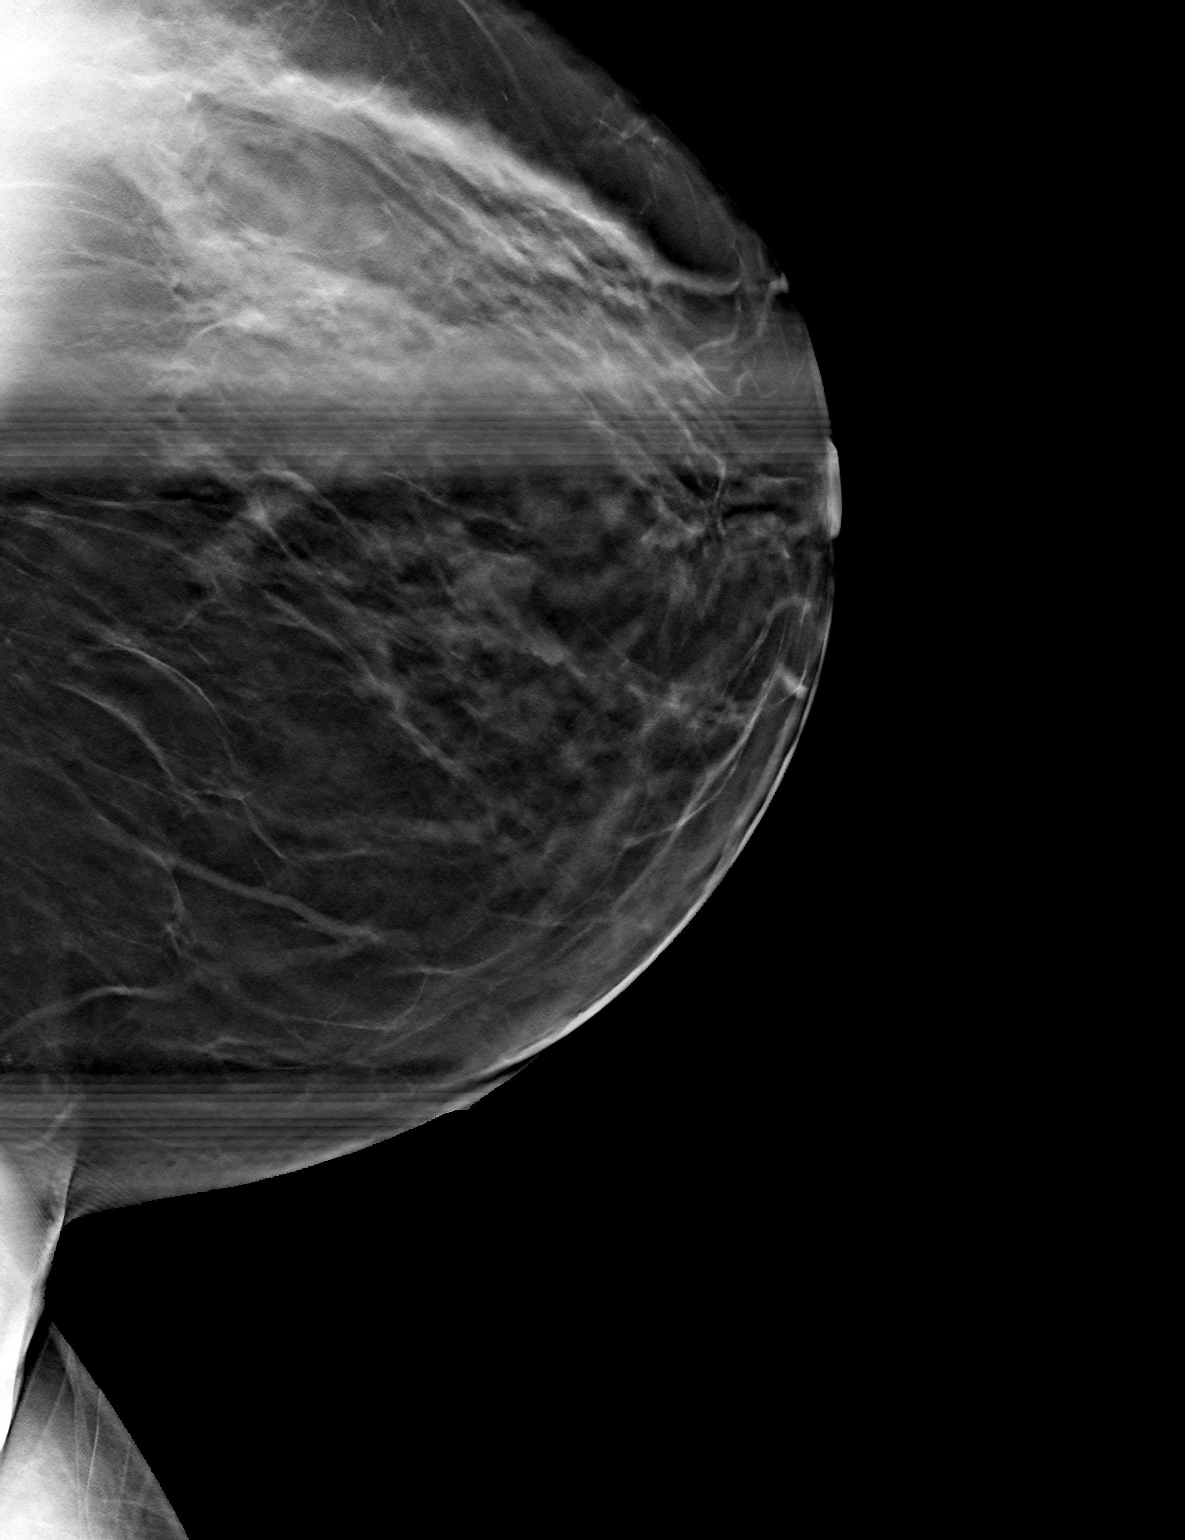
[frame 31/62]
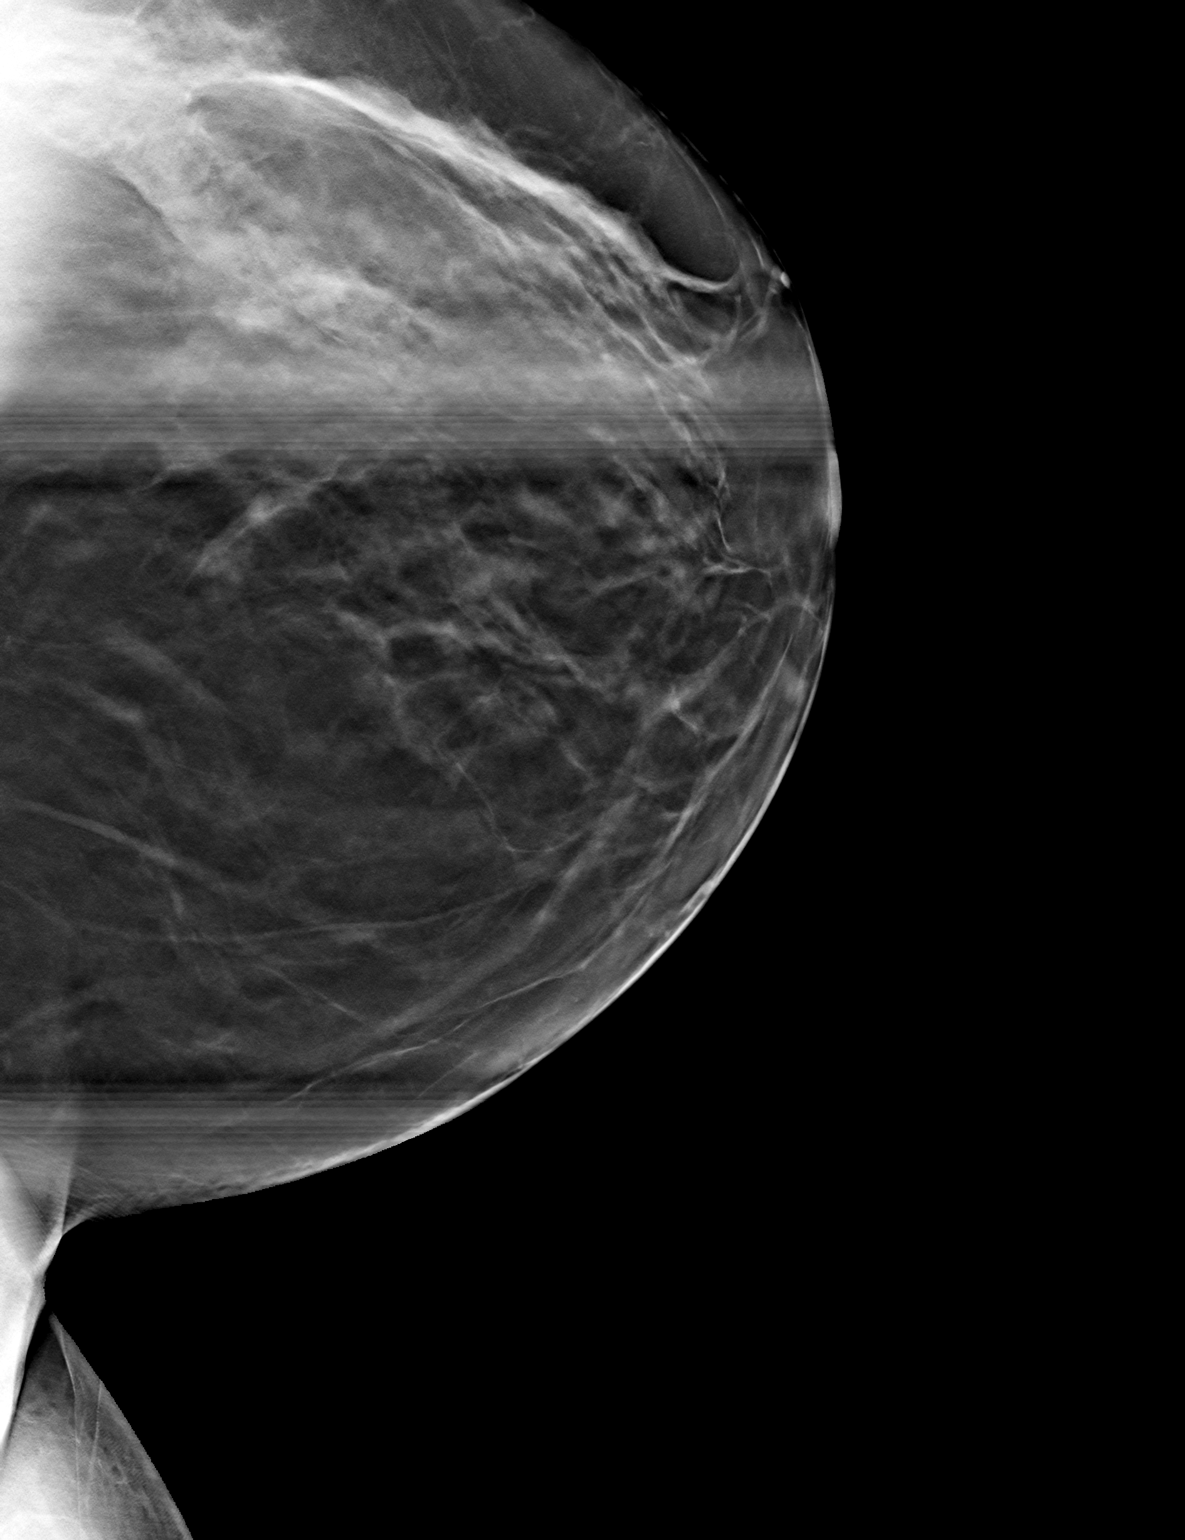

[3 of 6 positions shown; findings below may reference images not displayed]

ACR Breast Density Category c: The breast tissue is heterogeneously
dense, which may obscure small masses.
FINDINGS: The asymmetry seen in the central left breast on the MLO view
persists on the true lateral view, however no corresponding
abnormality is seen on the CC images. This area appears to efface on
the spot compression tomosynthesis images. However, ultrasound will
be performed for further evaluation given the density of the
surrounding breast tissue.

Mammographic images were processed with CAD.

Ultrasound of the left breast at 11 o'clock, 7 cm from the nipple
demonstrates a circumscribed anechoic oval mass measuring 8 x 5 x 6
mm.
IMPRESSION: The left breast mass at 11 o'clock corresponds with a benign cyst.

RECOMMENDATION:
Screening mammogram in one year.(Code:Y5-I-YUS)

I have discussed the findings and recommendations with the patient.
Results were also provided in writing at the conclusion of the
visit. If applicable, a reminder letter will be sent to the patient
regarding the next appointment.

BI-RADS CATEGORY  2: Benign.

## 2019-07-15 ENCOUNTER — Other Ambulatory Visit: Payer: Self-pay | Admitting: *Deleted

## 2019-07-15 MED ORDER — MONTELUKAST SODIUM 10 MG PO TABS: 10.0000 mg | ORAL_TABLET | Freq: Every day | ORAL | 3 refills | Status: DC

## 2019-07-23 ENCOUNTER — Other Ambulatory Visit: Payer: Self-pay | Admitting: Family Medicine

## 2019-07-26 ENCOUNTER — Other Ambulatory Visit: Payer: Self-pay

## 2019-07-26 DIAGNOSIS — Z20822 Contact with and (suspected) exposure to covid-19: Secondary | ICD-10-CM

## 2019-07-28 LAB — NOVEL CORONAVIRUS, NAA: SARS-CoV-2, NAA: NOT DETECTED

## 2019-08-06 ENCOUNTER — Other Ambulatory Visit (HOSPITAL_COMMUNITY): Payer: Self-pay | Admitting: Adult Health

## 2019-08-06 DIAGNOSIS — Z1231 Encounter for screening mammogram for malignant neoplasm of breast: Secondary | ICD-10-CM

## 2019-08-15 ENCOUNTER — Other Ambulatory Visit: Payer: Self-pay

## 2019-08-15 ENCOUNTER — Ambulatory Visit (HOSPITAL_COMMUNITY)
Admission: RE | Admit: 2019-08-15 | Discharge: 2019-08-15 | Disposition: A | Discharge status: Home / Self Care | Payer: BC Managed Care – PPO | Source: Ambulatory Visit | Attending: Adult Health | Admitting: Adult Health

## 2019-08-15 DIAGNOSIS — Z1231 Encounter for screening mammogram for malignant neoplasm of breast: Secondary | ICD-10-CM | POA: Insufficient documentation

## 2019-10-25 ENCOUNTER — Telehealth: Payer: Self-pay | Admitting: Family Medicine

## 2019-10-25 ENCOUNTER — Other Ambulatory Visit: Payer: Self-pay

## 2019-10-25 ENCOUNTER — Encounter: Payer: Self-pay | Admitting: Family Medicine

## 2019-10-25 ENCOUNTER — Ambulatory Visit: Payer: BC Managed Care – PPO | Admitting: Family Medicine

## 2019-10-25 ENCOUNTER — Ambulatory Visit
Admission: RE | Admit: 2019-10-25 | Discharge: 2019-10-25 | Disposition: A | Discharge status: Home / Self Care | Payer: BC Managed Care – PPO | Source: Ambulatory Visit | Attending: Family Medicine | Admitting: Family Medicine

## 2019-10-25 VITALS — BP 132/82 | HR 76 | Temp 98.3°F | Ht 62.0 in | Wt 188.3 lb

## 2019-10-25 DIAGNOSIS — M7989 Other specified soft tissue disorders: Secondary | ICD-10-CM

## 2019-10-25 DIAGNOSIS — M79672 Pain in left foot: Secondary | ICD-10-CM | POA: Diagnosis not present

## 2019-10-25 DIAGNOSIS — M79671 Pain in right foot: Secondary | ICD-10-CM

## 2019-10-25 NOTE — Telephone Encounter (Signed)
Patient's requesting to switch from Dr.Sonnenberg to [facility].  Patient has had a hard time getting an appointment in [place].  Can patient switch?

## 2019-10-25 NOTE — Patient Instructions (Signed)
Roll your feet over a frozen water bottle for pain relief and elevate them if needed  Wear supportive shoes - consider inserts (from the pharmacy)  You can make an appointment with Dr Patsy Lager (sport med here) for further eval   We will send you for an ultrasound of your leg to make sure there is no blood clot   Think about quitting smoking

## 2019-10-25 NOTE — Progress Notes (Signed)
Subjective:    Patient ID: Bonnie Murphy, female    DOB: Mar 16, 1971, 49 y.o.   MRN: [id number]  This visit occurred during the SARS-CoV-2 public health emergency.  Safety protocols were in place, including screening questions prior to the visit, additional usage of staff PPE, and extensive cleaning of exam room while observing appropriate contact time as indicated for disinfecting solutions.    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 Right leg swelling 10/25/2019  . Bilateral foot pain 10/25/2019  . Screening for colorectal cancer 04/08/2019  . Encounter for well woman exam with routine gynecological exam 04/08/2019  . Encounter for general adult medical examination with abnormal findings 01/12/2016  . Obese 06/05/2015  . IUD  (intrauterine device) in place 11/21/2014  . OAB (overactive bladder) 11/21/2014  . Tobacco use disorder 11/17/2014  . Wheezing 12-04-2013   Past Medical History:  Diagnosis Date  . Asthma    seasonal  . Cyst of right breast   . IUD (intrauterine device) in place 11/21/2014  . OAB (overactive bladder) 11/21/2014  . Wheezing December 04, 2013   Smokes, dad died with lung cancer get chest xray   Past Surgical History:  Procedure Laterality Date  . CESAREAN SECTION     Social History   Tobacco Use  . Smoking status: Current Every Day Smoker    Packs/day: 1.00    Years: 31.00    Pack years: 31.00    Types: Cigarettes  . Smokeless tobacco: Never Used  Substance Use Topics  . Alcohol use: Yes    Alcohol/week: 1.0 standard drinks    Types: 1 Standard drinks or equivalent per week    Comment: rarely  . Drug use: No   Family History  Problem Relation Age of Onset  . Diabetes Mother   . Cancer Father        lung  . Cirrhosis Father   . Cancer Paternal Aunt        lung  .  Cancer Paternal Uncle        lung  . Cirrhosis Paternal Grandmother    No Known Allergies Current Outpatient Medications on File Prior to Visit  Medication Sig Dispense Refill  . levonorgestrel (LILETTA, 52 MG,) 19.5 MCG/DAY IUD IUD 1 each by Intrauterine route once.    . montelukast (SINGULAIR) 10 MG tablet Take 1 tablet (10 mg total) by mouth at bedtime. 30 tablet 3  . VENTOLIN HFA 108 (90 Base) MCG/ACT inhaler INHALE 2 PUFFS BY MOUTH EVERY 6 HOURS AS NEEDED FOR WHEEZING OR SHORTNESS OF BREATH 18 g 0   No current facility-administered medications on file prior to visit.    Review of Systems  Constitutional: Negative for activity change, appetite change, fatigue, fever and unexpected weight change.  HENT: Negative for congestion, ear pain, rhinorrhea, sinus pressure and sore throat.   Eyes: Negative for pain, redness and visual disturbance.  Respiratory: Negative for cough, shortness of breath and wheezing.     Cardiovascular: Positive for leg swelling. Negative for chest pain and palpitations.  Gastrointestinal: Negative for abdominal pain, blood in stool, constipation and diarrhea.  Endocrine: Negative for polydipsia and polyuria.  Genitourinary: Negative for dysuria, frequency and urgency.  Musculoskeletal: Positive for arthralgias. Negative for back pain, joint swelling and myalgias.  Skin: Negative for pallor and rash.  Allergic/Immunologic: Negative for environmental allergies.  Neurological: Negative for dizziness, syncope and headaches.  Hematological: Negative for adenopathy. Does not bruise/bleed easily.  Psychiatric/Behavioral: Negative for decreased concentration and dysphoric mood. The patient is not nervous/anxious.        Objective:   Physical Exam Constitutional:      General: She is not in acute distress.    Appearance: Normal appearance. She is obese. She is not ill-appearing or diaphoretic.  HENT:     Head: Normocephalic and atraumatic.     Mouth/Throat:     Mouth: Mucous membranes are moist.  Eyes:     General: No scleral icterus.    Conjunctiva/sclera: Conjunctivae normal.     Pupils: Pupils are equal, round, and reactive to light.  Cardiovascular:     Rate and Rhythm: Normal rate and regular rhythm.     Pulses: Normal pulses.     Heart sounds: Murmur present.  Pulmonary:     Effort: Pulmonary effort is normal. No respiratory distress.     Breath sounds: Normal breath sounds. No wheezing.     Comments: Diffusely distant bs  Musculoskeletal:        General: Swelling and tenderness present. No deformity.     Cervical back: Normal range of motion and neck supple. No tenderness.     Right lower leg: No edema.     Comments: Left calf circ is 35 cm R calf circ is 36.5 cm   R calf feels tight to pt when palpating No palp cord No redness or heat  Neg homan's sign  Some tenderness in arch of L foot  No achilles tenderness  Feet are not swollen  Perfusion and  circulation intact  Lymphadenopathy:     Cervical: No cervical adenopathy.  Skin:    General: Skin is warm and dry.     Coloration: Skin is not pale.     Findings: No erythema or rash.  Neurological:     Mental Status: She is alert.     Sensory: No sensory deficit.     Motor: No weakness.     Gait: Gait normal.     Deep Tendon Reflexes: Reflexes  normal.  Psychiatric:        Mood and Affect: Mood normal.           Assessment & Plan:   Problem List Items Addressed This Visit      Other   Right leg swelling - Primary    Mild swelling of R calf w/o erythema/palp cord or tenderness  Venous doppler ordered to assess for DVT- now  Pt is smoker and at higher risk for DVT  (recommend smoking cessation) Recommend elevation /support        Relevant Orders   US Venous Img Lower Unilateral Right (Completed)   Bilateral foot pain    With some tenderness in arch of L foot  No other focal findings  She does stand all day for work  Recommend ice / massage of feet with frozen water bottle  Elevate feet when able  Wear supportive shoes / try inserts otc  Recommend she make an appt with sport med-may benefit from further eval and orthotics

## 2019-10-25 NOTE — Telephone Encounter (Signed)
This is fine with me   

## 2019-10-25 NOTE — Telephone Encounter (Signed)
Fine with me

## 2019-10-27 NOTE — Assessment & Plan Note (Signed)
Mild swelling of R calf w/o erythema/palp cord or tenderness  Venous doppler ordered to assess for DVT- now  Pt is smoker and at higher risk for DVT  (recommend smoking cessation) Recommend elevation Bonnie Murphy

## 2019-10-27 NOTE — Assessment & Plan Note (Signed)
With some tenderness in arch of L foot  No other focal findings  She does stand all day for work  Recommend ice / massage of feet with frozen water bottle  Elevate feet when able  Wear supportive shoes / try inserts otc  Recommend she make an appt with sport med-may benefit from further eval and orthotics

## 2019-10-29 ENCOUNTER — Other Ambulatory Visit: Payer: Self-pay

## 2019-10-29 ENCOUNTER — Encounter: Payer: Self-pay | Admitting: Internal Medicine

## 2019-10-29 ENCOUNTER — Ambulatory Visit: Payer: BC Managed Care – PPO | Admitting: Internal Medicine

## 2019-10-29 VITALS — BP 126/84 | HR 81 | Temp 97.9°F | Wt 186.0 lb

## 2019-10-29 DIAGNOSIS — Z1321 Encounter for screening for nutritional disorder: Secondary | ICD-10-CM | POA: Diagnosis not present

## 2019-10-29 DIAGNOSIS — N3281 Overactive bladder: Secondary | ICD-10-CM | POA: Diagnosis not present

## 2019-10-29 DIAGNOSIS — R7303 Prediabetes: Secondary | ICD-10-CM

## 2019-10-29 DIAGNOSIS — J4520 Mild intermittent asthma, uncomplicated: Secondary | ICD-10-CM

## 2019-10-29 DIAGNOSIS — J45909 Unspecified asthma, uncomplicated: Secondary | ICD-10-CM | POA: Insufficient documentation

## 2019-10-29 MED ORDER — ALBUTEROL SULFATE HFA 108 (90 BASE) MCG/ACT IN AERS: 2.0000 | INHALATION_SPRAY | Freq: Four times a day (QID) | RESPIRATORY_TRACT | 2 refills | Status: DC | PRN

## 2019-10-29 NOTE — Patient Instructions (Signed)
Prediabetes Prediabetes is the condition of having a blood sugar (blood glucose) level that is higher than it should be, but not high enough for you to be diagnosed with type 2 diabetes. Having prediabetes puts you at risk for developing type 2 diabetes (type 2 diabetes mellitus). Prediabetes may be called impaired glucose tolerance or impaired fasting glucose. Prediabetes usually does not cause symptoms. Your health care provider can diagnose this condition with blood tests. You may be tested for prediabetes if you are overweight and if you have at least one other risk factor for prediabetes. What is blood glucose, and how is it measured? Blood glucose refers to the amount of glucose in your bloodstream. Glucose comes from eating foods that contain sugars and starches (carbohydrates), which the body breaks down into glucose. Your blood glucose level may be measured in mg/dL (milligrams per deciliter) or mmol/L (millimoles per liter). Your blood glucose may be checked with one or more of the following blood tests:  A fasting blood glucose (FBG) test. You will not be allowed to eat (you will fast) for 8 hours or longer before a blood sample is taken. ? A normal range for FBG is 70-100 mg/dl (3.9-5.6 mmol/L).  An A1c (hemoglobin A1c) blood test. This test provides information about blood glucose control over the previous 2?3months.  An oral glucose tolerance test (OGTT). This test measures your blood glucose at two times: ? After fasting. This is your baseline level. ? Two hours after you drink a beverage that contains glucose. You may be diagnosed with prediabetes:  If your FBG is 100?125 mg/dL (5.6-6.9 mmol/L).  If your A1c level is 5.7?6.4%.  If your OGTT result is 140?199 mg/dL (7.8-11 mmol/L). These blood tests may be repeated to confirm your diagnosis. How can this condition affect me? The pancreas produces a hormone (insulin) that helps to move glucose from the bloodstream into cells.  When cells in the body do not respond properly to insulin that the body makes (insulin resistance), excess glucose builds up in the blood instead of going into cells. As a result, high blood glucose (hyperglycemia) can develop, which can cause many complications. Hyperglycemia is a symptom of prediabetes. Having high blood glucose for a long time is dangerous. Too much glucose in your blood can damage your nerves and blood vessels. Long-term damage can lead to complications from diabetes, which may include:  Heart disease.  Stroke.  Blindness.  Kidney disease.  Depression.  Poor circulation in the feet and legs, which could lead to surgical removal (amputation) in severe cases. What can increase my risk? Risk factors for prediabetes include:  Having a family member with type 2 diabetes.  Being overweight or obese.  Being older than age 45.  Being of American Indian, African-American, Hispanic/Latino, or Asian/Pacific Islander descent.  Having an inactive (sedentary) lifestyle.  Having a history of heart disease.  History of gestational diabetes or polycystic ovary syndrome (PCOS), in women.  Having low levels of good cholesterol (HDL-C) or high levels of blood fats (triglycerides).  Having high blood pressure. What actions can I take to prevent diabetes?      Be physically active. ? Do moderate-intensity physical activity for 30 or more minutes on 5 or more days of the week, or as much as told by your health care provider. This could be brisk walking, biking, or water aerobics. ? Ask your health care provider what activities are safe for you. A mix of physical activities may be best, such as   walking, swimming, cycling, and strength training.  Lose weight as told by your health care provider. ? Losing 5-7% of your body weight can reverse insulin resistance. ? Your health care provider can determine how much weight loss is best for you and can help you lose weight  safely.  Follow a healthy meal plan. This includes eating lean proteins, complex carbohydrates, fresh fruits and vegetables, low-fat dairy products, and healthy fats. ? Follow instructions from your health care provider about eating or drinking restrictions. ? Make an appointment to see a diet and nutrition specialist (registered dietitian) to help you create a healthy eating plan that is right for you.  Do not smoke or use any tobacco products, such as cigarettes, chewing tobacco, and e-cigarettes. If you need help quitting, ask your health care provider.  Take over-the-counter and prescription medicines as told by your health care provider. You may be prescribed medicines that help lower the risk of type 2 diabetes.  Keep all follow-up visits as told by your health care provider. This is important. Summary  Prediabetes is the condition of having a blood sugar (blood glucose) level that is higher than it should be, but not high enough for you to be diagnosed with type 2 diabetes.  Having prediabetes puts you at risk for developing type 2 diabetes (type 2 diabetes mellitus).  To help prevent type 2 diabetes, make lifestyle changes such as being physically active and eating a healthy diet. Lose weight as told by your health care provider. This information is not intended to replace advice given to you by your health care provider. Make sure you discuss any questions you have with your health care provider. Document Revised: 12/14/2018 Document Reviewed: 10/13/2015 Elsevier Patient Education  2020 [employer] Inc.  

## 2019-10-29 NOTE — Assessment & Plan Note (Signed)
Continue Singulair Albuterol refilled today.

## 2019-10-29 NOTE — Assessment & Plan Note (Signed)
CBC and CMET today 

## 2019-10-29 NOTE — Progress Notes (Signed)
HPI  Pt presents to the clinic today to establish care and for management of the conditions listed below.   Asthma: Seasonal. Managed on Singulair. She uses an Albuterol inhaler as needed.  OAB: Currently not an issue. She has taken AZO OTC as needed with good relief of symptoms.  Flu: never Tetanus: 11/2014 Pap Smear: 12/2016 Mammogram: 08/2019 Colon Screening: never Vision Screening: annually Dentist: biannually  Past Medical History:  Diagnosis Date  . Asthma    seasonal  . Cyst of right breast   . IUD (intrauterine device) in place 11/21/2014  . OAB (overactive bladder) 11/21/2014  . Wheezing 12-07-2013   Smokes, dad died with lung cancer get chest xray    Current Outpatient Medications  Medication Sig Dispense Refill  . ibuprofen (ADVIL) 200 MG tablet Take 800 mg by mouth 2 (two) times daily as needed.    Marland Kitchen levonorgestrel (LILETTA, 52 MG,) 19.5 MCG/DAY IUD IUD 1 each by Intrauterine route once.    . montelukast (SINGULAIR) 10 MG tablet Take 1 tablet (10 mg total) by mouth at bedtime. 30 tablet 3   No current facility-administered medications for this visit.    No Known Allergies  Family History  Problem Relation Age of Onset  . Diabetes Mother   . Lung cancer Mother   . Cirrhosis Father   . Lung cancer Father   . Lung cancer Paternal Aunt   . Lung cancer Paternal Uncle   . Cirrhosis Paternal Grandmother     Social History   Socioeconomic History  . Marital status: Married    Spouse name: Not on file  . Number of children: Not on file  . Years of education: Not on file  . Highest education level: Not on file  Occupational History  . Not on file  Tobacco Use  . Smoking status: Current Every Day Smoker    Packs/day: 1.00    Years: 31.00    Pack years: 31.00    Types: Cigarettes  . Smokeless tobacco: Never Used  Substance and Sexual Activity  . Alcohol use: Yes    Alcohol/week: 1.0 standard drinks    Types: 1 Standard drinks or equivalent per week   Comment: rarely  . Drug use: No  . Sexual activity: Yes    Partners: Male    Birth control/protection: I.U.D.    Comment: Husband   Other Topics Concern  . 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 Difficulty of Paying Living Expenses: Not on file  Food Insecurity:   . Worried About Programme researcher, broadcasting/film/video in the Last Year: Not on file  . Ran Out of Food in the Last Year: Not on file  Transportation Needs:   . Lack of Transportation (Medical): Not on file  . Lack of Transportation (Non-Medical): Not on file  Physical Activity:   . Days of Exercise per Week: Not on file  . Minutes of Exercise per Session: Not on file  Stress:   . Feeling of Stress : Not on file  Social Connections:   . Frequency of Communication with Friends and Family: Not on file  . Frequency of Social Gatherings with Friends and Family: Not on  file  . Attends Religious Services: Not on file  . Active Member of Clubs or Organizations: Not on file  . Attends Archivist Meetings: Not on file  . Marital Status: Not on file  Intimate Partner Violence:   . Fear of Current or Ex-Partner: Not on file  . Emotionally Abused: Not on file  . Physically Abused: Not on file  . Sexually Abused: Not on file    ROS:  Constitutional: Denies fever, malaise, fatigue, headache or abrupt weight changes.  HEENT: Denies eye pain, eye redness, ear pain, ringing in the ears, wax buildup, runny nose, nasal congestion, bloody nose, or sore throat. Respiratory: Pt reports intermittent intermittent wheezing. Denies difficulty breathing, shortness of breath, cough or sputum production.   Cardiovascular: Denies chest pain, chest tightness, palpitations or swelling in the hands or  feet.  Gastrointestinal: Denies abdominal pain, bloating, constipation, diarrhea or blood in the stool.  GU: Denies frequency, urgency, pain with urination, blood in urine, odor or discharge. Musculoskeletal: Denies decrease in range of motion, difficulty with gait, muscle pain or joint pain and swelling.  Skin: Denies redness, rashes, lesions or ulcercations.  Neurological: Denies dizziness, difficulty with memory, difficulty with speech or problems with balance and coordination.  Psych: Denies anxiety, depression, SI/HI.  No other specific complaints in a complete review of systems (except as listed in HPI above).  PE:  BP 126/84   Pulse 81   Temp 97.9 F (36.6 C) (Temporal)   Wt 204 lb (92.5 kg)   SpO2 96%   BMI 37.31 kg/m   Wt Readings from Last 3 Encounters:  10/25/19 188 lb 5 oz (85.4 kg)  04/08/19 181 lb 8 oz (82.3 kg)  03/06/19 178 lb (80.7 kg)    General: Appears her stated age, obese, in NAD. Cardiovascular: Normal rate and rhythm. S1,S2 noted.  Murmur noted. No JVD or BLE edema.  Pulmonary/Chest: Normal effort and positive vesicular breath sounds. No respiratory distress. No wheezes, rales or ronchi noted.  AMusculoskeletal:  No difficulty with gait.  Neurological: Alert and oriented.  Psychiatric: Mood and affect normal. Behavior is normal. Judgment and thought content normal.     BMET    Component Value Date/Time   NA 140 06/22/2018 1500   NA 140 11/21/2014 0910   K 4.6 06/22/2018 1500   CL 106 06/22/2018 1500   CO2 24 06/22/2018 1500   GLUCOSE 85 06/22/2018 1500   BUN 13 06/22/2018 1500   BUN 8 11/21/2014 0910   CREATININE 0.62 06/22/2018 1500   CALCIUM 9.4 06/22/2018 1500   GFRNONAA 109 11/21/2014 0910   GFRAA 126 11/21/2014 0910    Lipid Panel     Component Value Date/Time   CHOL 219 (H) 06/22/2018 1500   CHOL 180 11/21/2014 0910   TRIG 141 06/22/2018 1500   HDL 53 06/22/2018 1500   HDL 43 11/21/2014 0910   CHOLHDL 4.1 06/22/2018 1500    VLDL 24.8 01/27/2017 0912   LDLCALC 139 (H) 06/22/2018 1500    CBC    Component Value Date/Time   WBC 9.6 06/22/2018 1500   RBC 4.78 06/22/2018 1500   HGB 14.1 06/22/2018 1500   HCT 41.2 06/22/2018 1500   PLT 289 06/22/2018 1500   MCV 86.2 06/22/2018 1500   MCH 29.5 06/22/2018 1500   MCHC 34.2 06/22/2018 1500   RDW 13.0 06/22/2018 1500   RDW 13.6 11/21/2014 0910   LYMPHSABS 3.7 01/12/2016 1524   MONOABS 0.4 01/12/2016 1524   EOSABS  0.1 01/12/2016 1524   BASOSABS 0.0 01/12/2016 1524    Hgb A1C Lab Results  Component Value Date   HGBA1C 5.5 06/22/2018     Assessment and Plan:  Prediabetes:  A1C and lipid profile today  Screen for Vitamin Deficiency:  Vit D today   Nicki Reaper, NP This visit occurred during the SARS-CoV-2 public health emergency.  Safety protocols were in place, including screening questions prior to the visit, additional usage of staff PPE, and extensive cleaning of exam room while observing appropriate contact time as indicated for disinfecting solutions.

## 2019-10-30 LAB — COMPREHENSIVE METABOLIC PANEL
ALT: 21 U/L (ref 0–35)
AST: 18 U/L (ref 0–37)
Albumin: 4.3 g/dL (ref 3.5–5.2)
Alkaline Phosphatase: 64 U/L (ref 39–117)
BUN: 19 mg/dL (ref 6–23)
CO2: 25 mEq/L (ref 19–32)
Calcium: 9.5 mg/dL (ref 8.4–10.5)
Chloride: 107 mEq/L (ref 96–112)
Creatinine, Ser: 0.71 mg/dL (ref 0.40–1.20)
GFR: 87.15 mL/min (ref >60.00)
Glucose, Bld: 80 mg/dL (ref 70–99)
Potassium: 4.4 mEq/L (ref 3.5–5.1)
Sodium: 141 mEq/L (ref 135–145)
Total Bilirubin: 0.4 mg/dL (ref 0.2–1.2)
Total Protein: 7.1 g/dL (ref 6.0–8.3)

## 2019-10-30 LAB — LIPID PANEL
Cholesterol: 217 mg/dL — ABNORMAL HIGH (ref 0–200)
HDL: 46.8 mg/dL (ref >39.00)
LDL Cholesterol: 148 mg/dL — ABNORMAL HIGH (ref 0–99)
NonHDL: 170.04
Total CHOL/HDL Ratio: 5
Triglycerides: 108 mg/dL (ref 0.0–149.0)
VLDL: 21.6 mg/dL (ref 0.0–40.0)

## 2019-10-30 LAB — HEMOGLOBIN A1C
Hgb A1c MFr Bld: 5.6 % of total Hgb (ref <5.7)
Mean Plasma Glucose: 114 (calc)
eAG (mmol/L): 6.3 (calc)

## 2019-10-30 LAB — CBC
HCT: 42.9 % (ref 36.0–46.0)
Hemoglobin: 14.3 g/dL (ref 12.0–15.0)
MCHC: 33.3 g/dL (ref 30.0–36.0)
MCV: 88.3 fl (ref 78.0–100.0)
Platelets: 322 10*3/uL (ref 150.0–400.0)
RBC: 4.85 Mil/uL (ref 3.87–5.11)
RDW: 13.4 % (ref 11.5–15.5)
WBC: 12.7 10*3/uL — ABNORMAL HIGH (ref 4.0–10.5)

## 2019-10-30 LAB — VITAMIN D 25 HYDROXY (VIT D DEFICIENCY, FRACTURES): VITD: 28.55 ng/mL — ABNORMAL LOW (ref 30.00–100.00)

## 2019-11-15 ENCOUNTER — Telehealth: Payer: Self-pay

## 2019-11-15 NOTE — Telephone Encounter (Signed)
Pt reports continued foot pain. Pt is taking 800mg  ibuprofen, alternating with 2 aleve. Pt has apt with podiatrist on 3/17 but is wanting to know if PCP has any suggestions to control the pain until she has the appt.

## 2019-11-15 NOTE — Telephone Encounter (Signed)
I haven't seen her for foot pain. What exactly is going on?

## 2019-11-15 NOTE — Telephone Encounter (Signed)
Pt expressed understanding

## 2019-11-15 NOTE — Telephone Encounter (Signed)
Left foot pain, top of foot is painful and side only when she is weight bearing... ibuprofen is helping... denies swelling... Also has been using towel stretches and ice...   Taking Ibuprofen 800mg  every 4 hours...   Advised pt that she should not take more than every 8 hours  Called Baity for further instruction and she stated what pt has been doing is what she recommends and not taking ibuprofen 800mg  more than TID, offer pt appt for Monday to be evaluated and/or xray if needed in the meantime. Sunday

## 2019-12-05 ENCOUNTER — Encounter: Payer: BC Managed Care – PPO | Admitting: Internal Medicine

## 2019-12-17 ENCOUNTER — Encounter: Payer: Self-pay | Admitting: Internal Medicine

## 2019-12-17 ENCOUNTER — Other Ambulatory Visit: Payer: Self-pay

## 2019-12-17 ENCOUNTER — Ambulatory Visit (INDEPENDENT_AMBULATORY_CARE_PROVIDER_SITE_OTHER): Payer: BC Managed Care – PPO | Admitting: Internal Medicine

## 2019-12-17 VITALS — BP 130/82 | HR 71 | Temp 97.7°F | Ht 62.0 in | Wt 181.0 lb

## 2019-12-17 DIAGNOSIS — Z0000 Encounter for general adult medical examination without abnormal findings: Secondary | ICD-10-CM | POA: Diagnosis not present

## 2019-12-17 NOTE — Patient Instructions (Signed)
Health Maintenance, Female Adopting a healthy lifestyle and getting preventive care are important in promoting health and wellness. Ask your health care provider about:  The right schedule for you to have regular tests and exams.  Things you can do on your own to prevent diseases and keep yourself healthy. What should I know about diet, weight, and exercise? Eat a healthy diet   Eat a diet that includes plenty of vegetables, fruits, low-fat dairy products, and lean protein.  Do not eat a lot of foods that are high in solid fats, added sugars, or sodium. Maintain a healthy weight Body mass index (BMI) is used to identify weight problems. It estimates body fat based on height and weight. Your health care provider can help determine your BMI and help you achieve or maintain a healthy weight. Get regular exercise Get regular exercise. This is one of the most important things you can do for your health. Most adults should:  Exercise for at least 150 minutes each week. The exercise should increase your heart rate and make you sweat (moderate-intensity exercise).  Do strengthening exercises at least twice a week. This is in addition to the moderate-intensity exercise.  Spend less time sitting. Even light physical activity can be beneficial. Watch cholesterol and blood lipids Have your blood tested for lipids and cholesterol at 50 years of age, then have this test every 5 years. Have your cholesterol levels checked more often if:  Your lipid or cholesterol levels are high.  You are older than 50 years of age.  You are at high risk for heart disease. What should I know about cancer screening? Depending on your health history and family history, you may need to have cancer screening at various ages. This may include screening for:  Breast cancer.  Cervical cancer.  Colorectal cancer.  Skin cancer.  Lung cancer. What should I know about heart disease, diabetes, and high blood  pressure? Blood pressure and heart disease  High blood pressure causes heart disease and increases the risk of stroke. This is more likely to develop in people who have high blood pressure readings, are of African descent, or are overweight.  Have your blood pressure checked: ? Every 3-5 years if you are 18-39 years of age. ? Every year if you are 40 years old or older. Diabetes Have regular diabetes screenings. This checks your fasting blood sugar level. Have the screening done:  Once every three years after age 40 if you are at a normal weight and have a low risk for diabetes.  More often and at a younger age if you are overweight or have a high risk for diabetes. What should I know about preventing infection? Hepatitis B If you have a higher risk for hepatitis B, you should be screened for this virus. Talk with your health care provider to find out if you are at risk for hepatitis B infection. Hepatitis C Testing is recommended for:  Everyone born from 1945 through 1965.  Anyone with known risk factors for hepatitis C. Sexually transmitted infections (STIs)  Get screened for STIs, including gonorrhea and chlamydia, if: ? You are sexually active and are younger than 50 years of age. ? You are older than 50 years of age and your health care provider tells you that you are at risk for this type of infection. ? Your sexual activity has changed since you were last screened, and you are at increased risk for chlamydia or gonorrhea. Ask your health care provider if   you are at risk.  Ask your health care provider about whether you are at high risk for HIV. Your health care provider may recommend a prescription medicine to help prevent HIV infection. If you choose to take medicine to prevent HIV, you should first get tested for HIV. You should then be tested every 3 months for as long as you are taking the medicine. Pregnancy  If you are about to stop having your period (premenopausal) and  you may become pregnant, seek counseling before you get pregnant.  Take 400 to 800 micrograms (mcg) of folic acid every day if you become pregnant.  Ask for birth control (contraception) if you want to prevent pregnancy. Osteoporosis and menopause Osteoporosis is a disease in which the bones lose minerals and strength with aging. This can result in bone fractures. If you are 65 years old or older, or if you are at risk for osteoporosis and fractures, ask your health care provider if you should:  Be screened for bone loss.  Take a calcium or vitamin D supplement to lower your risk of fractures.  Be given hormone replacement therapy (HRT) to treat symptoms of menopause. Follow these instructions at home: Lifestyle  Do not use any products that contain nicotine or tobacco, such as cigarettes, e-cigarettes, and chewing tobacco. If you need help quitting, ask your health care provider.  Do not use street drugs.  Do not share needles.  Ask your health care provider for help if you need support or information about quitting drugs. Alcohol use  Do not drink alcohol if: ? Your health care provider tells you not to drink. ? You are pregnant, may be pregnant, or are planning to become pregnant.  If you drink alcohol: ? Limit how much you use to 0-1 drink a day. ? Limit intake if you are breastfeeding.  Be aware of how much alcohol is in your drink. In the [place] one drink equals one 12 oz bottle of beer (355 mL), one 5 oz glass of wine (148 mL), or one 1 oz glass of hard liquor (44 mL). General instructions  Schedule regular health, dental, and eye exams.  Stay current with your vaccines.  Tell your health care provider if: ? You often feel depressed. ? You have ever been abused or do not feel safe at home. Summary  Adopting a healthy lifestyle and getting preventive care are important in promoting health and wellness.  Follow your health care provider's instructions about healthy  diet, exercising, and getting tested or screened for diseases.  Follow your health care provider's instructions on monitoring your cholesterol and blood pressure. This information is not intended to replace advice given to you by your health care provider. Make sure you discuss any questions you have with your health care provider. Document Revised: 08/15/2018 Document Reviewed: 08/15/2018 Elsevier Patient Education  2020 [employer] Inc.  

## 2019-12-17 NOTE — Progress Notes (Signed)
Subjective:    Patient ID: Bonnie Murphy, female    DOB: 08-15-1971, 49 y.o.   MRN: [id number]  HPI  Pt presents to the clinic today for her annual exam.  Flu: 05/2016 Tetanus: 11/2014 Pap Smear: 12/2016 Mammogram: 08/2019 Colon Screening: never Vision Screening: annually Dentist: biannually  Diet: She does eat meat. She does not consume a lot of fruits or veggies. She tries to avoid fried foods.  Exercise: None due to left foot injury  Review of Systems      Past Medical History:  Diagnosis Date  . Asthma    seasonal  . Cyst of right breast   . IUD (intrauterine device) in place 11/21/2014  . OAB (overactive bladder) 11/21/2014  . Wheezing 11-29-13   Smokes, dad died with lung cancer get chest xray    Current Outpatient Medications  Medication Sig Dispense Refill  . albuterol (VENTOLIN HFA) 108 (90 Base) MCG/ACT inhaler Inhale 2 puffs into the lungs every 6 (six) hours as needed for wheezing or shortness of breath. 8 g 2  . ibuprofen (ADVIL) 200 MG tablet Take 800 mg by mouth 2 (two) times daily as needed.    Marland Kitchen levonorgestrel (LILETTA, 52 MG,) 19.5 MCG/DAY IUD IUD 1 each by Intrauterine route once.    . montelukast (SINGULAIR) 10 MG tablet Take 1 tablet (10 mg total) by mouth at bedtime. 30 tablet 3   No current facility-administered medications for this visit.    No Known Allergies  Family History  Problem Relation Age of Onset  . Diabetes Mother   . Lung cancer Mother   . Cirrhosis Father   . Lung cancer Father   . Lung cancer Paternal Aunt   . Lung cancer Paternal Uncle   . Cirrhosis Paternal Grandmother     Social History   Socioeconomic History  . Marital status: Married    Spouse name: Not on file  . Number of children: Not on file  . Years of education: Not on file  . Highest education level: Not on file  Occupational History  . Not on file  Tobacco Use  . Smoking status: Current Every Day Smoker    Packs/day: 1.00    Years: 31.00    Pack  years: 31.00    Types: Cigarettes  . Smokeless tobacco: Never Used  Substance and Sexual Activity  . Alcohol use: Yes    Alcohol/week: 1.0 standard drinks    Types: 1 Standard drinks or equivalent per week    Comment: rarely  . Drug use: No  . Sexual activity: Yes    Partners: Male    Birth control/protection: I.U.D.    Comment: Husband   Other Topics Concern  . 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 Difficulty of Paying Living Expenses:   Food Insecurity:   . Worried About Programme researcher, broadcasting/film/video in the Last Year:   . Barista in the Last Year:   Transportation Needs:   . Freight forwarder (Medical):   Marland Kitchen Lack of Transportation (Non-Medical):   Physical Activity:   . Days of Exercise per Week:   . Minutes of Exercise per Session:  Stress:   . Feeling of Stress :   Social Connections:   . Frequency of Communication with Friends and Family:   . Frequency of Social Gatherings with Friends and Family:   . Attends Religious Services:   . Active Member of Clubs or Organizations:   . Attends Archivist Meetings:   Marland Kitchen Marital Status:   Intimate Partner Violence:   . Fear of Current or Ex-Partner:   . Emotionally Abused:   Marland Kitchen Physically Abused:   . Sexually Abused:      Constitutional: Denies fever, malaise, fatigue, headache or abrupt weight changes.  HEENT: Denies eye pain, eye redness, ear pain, ringing in the ears, wax buildup, runny nose, nasal congestion, bloody nose, or sore throat. Respiratory: Denies difficulty breathing, shortness of breath, cough or sputum production.   Cardiovascular: Denies chest pain, chest tightness, palpitations or swelling in the hands or feet.  Gastrointestinal: Denies  abdominal pain, bloating, constipation, diarrhea or blood in the stool.  GU: Denies urgency, frequency, pain with urination, burning sensation, blood in urine, odor or discharge. Musculoskeletal: Pt reports left foot pain. Denies decrease in range of motion, difficulty with gait, muscle pain or joint swelling.  Skin: Denies redness, rashes, lesions or ulcercations.  Neurological: Denies dizziness, difficulty with memory, difficulty with speech or problems with balance and coordination.  Psych: Denies anxiety, depression, SI/HI.  No other specific complaints in a complete review of systems (except as listed in HPI above).  Objective:   Physical Exam  BP 130/82   Pulse 71   Temp 97.7 F (36.5 C) (Temporal)   Ht 5\' 2"  (1.575 m)   Wt 181 lb (82.1 kg)   SpO2 98%   BMI 33.11 kg/m   Wt Readings from Last 3 Encounters:  10/29/19 186 lb (84.4 kg)  10/25/19 188 lb 5 oz (85.4 kg)  04/08/19 181 lb 8 oz (82.3 kg)    General: Appears her stated age, obese, in NAD. Skin: Warm, dry and intact. No rashes noted. HEENT: Head: normal shape and size; Eyes: sclera white, no icterus, conjunctiva pink, PERRLA and EOMs intact;  Neck:  Neck supple, trachea midline. No masses, lumps or thyromegaly present.  Cardiovascular: Normal rate and rhythm. S1,S2 noted.  No murmur, rubs or gallops noted. No JVD or BLE edema. No carotid bruits noted. Pulmonary/Chest: Normal effort and positive vesicular breath sounds. No respiratory distress. No wheezes, rales or ronchi noted.  Abdomen: Soft and nontender. Normal bowel sounds. No distention or masses noted. Liver, spleen and kidneys non palpable. Musculoskeletal: In walking boot. Strength 5/5 BUE/BLE. No difficulty with gait.  Neurological: Alert and oriented. Cranial nerves II-XII grossly intact. Coordination normal.  Psychiatric: Mood and affect normal. Behavior is normal. Judgment and thought content normal.    BMET    Component Value Date/Time   NA 141  10/29/2019 1612   NA 140 11/21/2014 0910   K 4.4 10/29/2019 1612   CL 107 10/29/2019 1612   CO2 25 10/29/2019 1612   GLUCOSE 80 10/29/2019 1612   BUN 19 10/29/2019 1612   BUN 8 11/21/2014 0910   CREATININE 0.71 10/29/2019 1612   CREATININE 0.62 06/22/2018 1500   CALCIUM 9.5 10/29/2019 1612   GFRNONAA 109 11/21/2014 0910   GFRAA 126 11/21/2014 0910    Lipid Panel     Component Value Date/Time   CHOL 217 (H) 10/29/2019 1612   CHOL 180 11/21/2014 0910   TRIG 108.0 10/29/2019 1612   HDL 46.80 10/29/2019 1612  HDL 43 11/21/2014 0910   CHOLHDL 5 10/29/2019 1612   VLDL 21.6 10/29/2019 1612   LDLCALC 148 (H) 10/29/2019 1612   LDLCALC 139 (H) 06/22/2018 1500    CBC    Component Value Date/Time   WBC 12.7 (H) 10/29/2019 1612   RBC 4.85 10/29/2019 1612   HGB 14.3 10/29/2019 1612   HCT 42.9 10/29/2019 1612   PLT 322.0 10/29/2019 1612   MCV 88.3 10/29/2019 1612   MCH 29.5 06/22/2018 1500   MCHC 33.3 10/29/2019 1612   RDW 13.4 10/29/2019 1612   RDW 13.6 11/21/2014 0910   LYMPHSABS 3.7 01/12/2016 1524   MONOABS 0.4 01/12/2016 1524   EOSABS 0.1 01/12/2016 1524   BASOSABS 0.0 01/12/2016 1524    Hgb A1C Lab Results  Component Value Date   HGBA1C 5.6 10/29/2019            Assessment & Plan:   Preventative Health Maintenance:  Encouraged her to get a flu shot in the fall Tetanus UTD Pap smear due 2022 Mammogram UTD Referral to GI for screening colonoscopy Encouraged her to consume a balanced diet and exercise regimen Advised her to see an eye doctor and dentist annually Las reviewed  RTC in 1 year, sooner if needed

## 2020-01-28 ENCOUNTER — Ambulatory Visit: Payer: BC Managed Care – PPO | Admitting: Podiatry

## 2020-02-28 ENCOUNTER — Ambulatory Visit: Payer: BC Managed Care – PPO | Admitting: Podiatry

## 2020-02-28 ENCOUNTER — Ambulatory Visit (INDEPENDENT_AMBULATORY_CARE_PROVIDER_SITE_OTHER): Payer: BC Managed Care – PPO

## 2020-02-28 ENCOUNTER — Other Ambulatory Visit: Payer: Self-pay

## 2020-02-28 DIAGNOSIS — M65172 Other infective (teno)synovitis, left ankle and foot: Secondary | ICD-10-CM

## 2020-02-28 DIAGNOSIS — M722 Plantar fascial fibromatosis: Secondary | ICD-10-CM

## 2020-02-28 DIAGNOSIS — B351 Tinea unguium: Secondary | ICD-10-CM | POA: Diagnosis not present

## 2020-02-28 DIAGNOSIS — L600 Ingrowing nail: Secondary | ICD-10-CM | POA: Diagnosis not present

## 2020-02-28 DIAGNOSIS — M79674 Pain in right toe(s): Secondary | ICD-10-CM

## 2020-02-28 DIAGNOSIS — M659 Synovitis and tenosynovitis, unspecified: Secondary | ICD-10-CM

## 2020-02-28 MED ORDER — MELOXICAM 15 MG PO TABS: 15.0000 mg | ORAL_TABLET | Freq: Every day | ORAL | 1 refills | Status: DC

## 2020-02-28 MED ORDER — TERBINAFINE HCL 250 MG PO TABS: 250.0000 mg | ORAL_TABLET | Freq: Every day | ORAL | 0 refills | Status: DC

## 2020-02-28 MED ORDER — GENTAMICIN SULFATE 0.1 % EX CREA: 1.0000 "application " | TOPICAL_CREAM | Freq: Two times a day (BID) | CUTANEOUS | 1 refills | Status: DC

## 2020-02-28 MED ORDER — METHYLPREDNISOLONE 4 MG PO TBPK: ORAL_TABLET | ORAL | 0 refills | Status: DC

## 2020-02-28 NOTE — Progress Notes (Signed)
   Subjective: Patient presents today for evaluation of pain to the lateral border of the right great toe. Patient is concerned for possible ingrown nail. Patient presents today for further treatment and evaluation.  Past Medical History:  Diagnosis Date  . Asthma    seasonal  . Cyst of right breast   . IUD (intrauterine device) in place 11/21/2014  . OAB (overactive bladder) 11/21/2014  . Wheezing 12-06-13   Smokes, dad died with lung cancer get chest xray    Objective:  General: Well developed, nourished, in no acute distress, alert and oriented x3   Dermatology: Skin is warm, dry and supple bilateral.  Lateral border right great toe appears to be erythematous with evidence of an ingrowing nail. Pain on palpation noted to the border of the nail fold. The remaining nails appear unremarkable at this time. There are no open sores, lesions.  Vascular: Dorsalis Pedis artery and Posterior Tibial artery pedal pulses palpable. No lower extremity edema noted.   Neruologic: Grossly intact via light touch bilateral.  Musculoskeletal: Muscular strength within normal limits in all groups bilateral. Normal range of motion noted to all pedal and ankle joints.   Assesement: #1 Paronychia with ingrowing nail lateral border right great toe #2  Dystrophic nail/onychomycosis right hallux 3.  Ankle synovitis left  Plan of Care:  1. Patient evaluated.  2. Discussed treatment alternatives and plan of care. Explained nail avulsion procedure and post procedure course to patient. 3. Patient opted for permanent partial nail avulsion of the lateral border right great toe.  4. Prior to procedure, local anesthesia infiltration utilized using 3 ml of a 50:50 mixture of 2% plain lidocaine and 0.5% plain marcaine in a normal hallux block fashion and a betadine prep performed.  5. Partial permanent nail avulsion with chemical matrixectomy performed using 3x30sec applications of phenol followed by alcohol flush.    6. Light dressing applied.  Prescription for gentamicin cream 7.  Prescription for Lamisil 250 mg #90.  Patient denies any complications or past medical history associated to liver function  8.  Prescription for Medrol Dosepak  9.  Prescription for meloxicam 15 mg daily after completion of the Dosepak  10.  Injection of 0.5 cc Celestone Soluspan injected into the left ankle lateral aspect 11.  Return to clinic in 2 weeks  *Works at the [employer] and [employer] in [place], [place]  Felecia Shelling, [facility]  Dr. Felecia Shelling, DPM    [address]                                        [place], [place] [address]                Office [phone]  Fax [phone]

## 2020-02-28 NOTE — Patient Instructions (Signed)

## 2020-03-17 ENCOUNTER — Encounter: Payer: Self-pay | Admitting: Podiatry

## 2020-03-17 ENCOUNTER — Other Ambulatory Visit: Payer: Self-pay

## 2020-03-17 ENCOUNTER — Ambulatory Visit: Payer: BC Managed Care – PPO | Admitting: Podiatry

## 2020-03-17 DIAGNOSIS — B351 Tinea unguium: Secondary | ICD-10-CM | POA: Diagnosis not present

## 2020-03-17 DIAGNOSIS — M659 Synovitis and tenosynovitis, unspecified: Secondary | ICD-10-CM | POA: Diagnosis not present

## 2020-03-17 DIAGNOSIS — L600 Ingrowing nail: Secondary | ICD-10-CM | POA: Diagnosis not present

## 2020-03-17 DIAGNOSIS — M79674 Pain in right toe(s): Secondary | ICD-10-CM | POA: Diagnosis not present

## 2020-03-17 MED ORDER — ITRACONAZOLE 100 MG PO CAPS: 100.0000 mg | ORAL_CAPSULE | Freq: Every day | ORAL | 0 refills | Status: DC

## 2020-03-17 NOTE — Progress Notes (Signed)
   Subjective: 49 y.o. female presents today status post permanent nail avulsion procedure of the lateral border right great toe that was performed on 02/28/2020.  Patient states that she is doing much better regarding the ingrown toenail.  She denies pain and she has been soaking it and applying antibiotic cream as directed.  She also states that her left ankle is feeling much better.  She says the injection helped significantly and she is taking meloxicam as needed.   Finally the patient states that she began to take the oral antifungal Lamisil 250 mg daily but it gave her headaches.  She discontinued taking the medication.  She is seeking different alternative.  Past Medical History:  Diagnosis Date  . Asthma    seasonal  . Cyst of right breast   . IUD (intrauterine device) in place 11/21/2014  . OAB (overactive bladder) 11/21/2014  . Wheezing 09-Dec-2013   Smokes, dad died with lung cancer get chest xray    Objective: Skin is warm, dry and supple. Nail and respective nail fold appears to be healing appropriately. Open wound to the associated nail fold with a granular wound base and moderate amount of fibrotic tissue. Minimal drainage noted. Mild erythema around the periungual region likely due to phenol chemical matricectomy.  There continues to be a hyperkeratotic dystrophic nail noted to the right hallux consistent with onychomycosis  Negative for any pain on palpation or range of motion to the left ankle joint   Assessment: #1 postop permanent partial nail avulsion lateral border right great toe #2 open wound periungual nail fold of respective digit.  #3 onychomycosis of toenail right hallux #4 left ankle synovitis-resolved  Plan of care: #1 patient was evaluated  #2 debridement of open wound was performed to the periungual border of the respective toe using a currette. Antibiotic ointment and Band-Aid was applied. #3  Discontinue Lamisil 250 mg.  Prescription for Sporanox 100 mg  daily #90.  Again, patient denies any hepatic pathology or past medical history  #4 continue meloxicam daily as needed #5 patient is to return to clinic on a PRN basis.  Works at the [employer] and [employer] in [place], [place]  Felecia Shelling, [facility]  Dr. Felecia Shelling, DPM    [address]                                        [place], [place] [address]                Office [phone]  Fax [phone]

## 2020-03-24 ENCOUNTER — Ambulatory Visit: Payer: BC Managed Care – PPO | Admitting: Podiatry

## 2020-03-24 ENCOUNTER — Encounter: Payer: Self-pay | Admitting: Podiatry

## 2020-03-24 ENCOUNTER — Other Ambulatory Visit: Payer: Self-pay

## 2020-03-24 DIAGNOSIS — M659 Synovitis and tenosynovitis, unspecified: Secondary | ICD-10-CM | POA: Diagnosis not present

## 2020-03-24 DIAGNOSIS — M778 Other enthesopathies, not elsewhere classified: Secondary | ICD-10-CM | POA: Diagnosis not present

## 2020-03-24 DIAGNOSIS — L600 Ingrowing nail: Secondary | ICD-10-CM

## 2020-03-24 DIAGNOSIS — B351 Tinea unguium: Secondary | ICD-10-CM | POA: Diagnosis not present

## 2020-03-24 DIAGNOSIS — M79674 Pain in right toe(s): Secondary | ICD-10-CM

## 2020-03-24 NOTE — Progress Notes (Signed)
   HPI: 49 y.o. female presenting today for evaluation of a new complaint that developed 1 day after the patient was seen here in the office on 03/17/2020.  Patient states that the day after she left the office she began to notice significant pain to the dorsal aspect of the left midfoot.  Patient denies injury.  She says that she does have a history of left midfoot pain and she did receive injections in the past.  She presents for further treatment evaluation  Past Medical History:  Diagnosis Date  . Asthma    seasonal  . Cyst of right breast   . IUD (intrauterine device) in place 11/21/2014  . OAB (overactive bladder) 11/21/2014  . Wheezing 11/30/2013   Smokes, dad died with lung cancer get chest xray     Physical Exam: General: The patient is alert and oriented x3 in no acute distress.  Dermatology: Skin is warm, dry and supple bilateral lower extremities. Negative for open lesions or macerations.  Vascular: Palpable pedal pulses bilaterally. No edema or erythema noted. Capillary refill within normal limits.  Neurological: Epicritic and protective threshold grossly intact bilaterally.   Musculoskeletal Exam: Range of motion within normal limits to all pedal and ankle joints bilateral. Muscle strength 5/5 in all groups bilateral.  There is pain on palpation noted to the midtarsal/Lisfranc joint left foot  Assessment: 1.  History of nail avulsion, permanent, lateral border right great toe 2.  Onychomycosis of toenail right hallux 3.  Left ankle synovitis-resolved 4.  Left midtarsal capsulitis   Plan of Care:  1. Patient evaluated. 2.  Injection of 0.5 cc Celestone Soluspan injected into the midtarsal joint/Lisfranc joint left 3.  Continue meloxicam daily 4.  OTC power step insoles provided today 5.  Continue Sporanox 100 mg daily as prescribed 6.  Return to clinic in 4 weeks  *Works at the [employer] and [employer] in [place], [place]      Felecia Shelling, [facility]  Dr. Felecia Shelling, DPM    [address] [place], [place] [address]                Office [phone]  Fax [phone]

## 2020-04-09 ENCOUNTER — Other Ambulatory Visit: Payer: Self-pay | Admitting: Internal Medicine

## 2020-04-09 ENCOUNTER — Telehealth: Payer: Self-pay | Admitting: Podiatry

## 2020-04-09 NOTE — Telephone Encounter (Signed)
Patient called saying the injections have not helped her foot. Wants to know if she could get an MRI to see if that would show what is causing the pain.

## 2020-04-13 NOTE — Telephone Encounter (Signed)
Dr. Logan Bores, do you want me to go ahead with precert for MRI or do you want to examine her first.  She has a follow up appt with you on 04/21/2020.  Please advise

## 2020-04-13 NOTE — Telephone Encounter (Signed)
Sure, you can go ahead with precert. MRI left ankle w/out contrast. - Dr. Logan Bores

## 2020-04-21 ENCOUNTER — Ambulatory Visit: Payer: BC Managed Care – PPO | Admitting: Podiatry

## 2020-04-21 ENCOUNTER — Other Ambulatory Visit: Payer: Self-pay

## 2020-04-21 DIAGNOSIS — M659 Synovitis and tenosynovitis, unspecified: Secondary | ICD-10-CM

## 2020-04-21 DIAGNOSIS — M778 Other enthesopathies, not elsewhere classified: Secondary | ICD-10-CM

## 2020-04-21 NOTE — Progress Notes (Signed)
   HPI: 49 y.o. female presenting today for follow-up evaluation of left foot and ankle.  Patient states that since last visit she was having pain however it is completely resolved.  She is doing very well and continues to take the Sporanox daily for her onychomycosis of the toenails.  She completed her meloxicam and currently she is doing very well with minimal pain.  No new complaints at this time  Past Medical History:  Diagnosis Date  . Asthma    seasonal  . Cyst of right breast   . IUD (intrauterine device) in place 11/21/2014  . OAB (overactive bladder) 11/21/2014  . Wheezing Nov 24, 2013   Smokes, dad died with lung cancer get chest xray     Physical Exam: General: The patient is alert and oriented x3 in no acute distress.  Dermatology: Skin is warm, dry and supple bilateral lower extremities. Negative for open lesions or macerations.  Vascular: Palpable pedal pulses bilaterally. No edema or erythema noted. Capillary refill within normal limits.  Neurological: Epicritic and protective threshold grossly intact bilaterally.   Musculoskeletal Exam: Range of motion within normal limits to all pedal and ankle joints bilateral. Muscle strength 5/5 in all groups bilateral.  There is no indication of pain on palpation noted to the midtarsal/Lisfranc joint left foot  Assessment: 1.  History of nail avulsion, permanent, lateral border right great toe 2.  Onychomycosis of toenail right hallux 3.  Left ankle synovitis-resolved 4.  Left midtarsal capsulitis-resolved   Plan of Care:  1. Patient evaluated. 2.  Continue Sporanox 100 mg daily as prescribed 3.  Continue OTC Motrin as needed.  Patient states that the Motrin works just fine.  She declined prescription for meloxicam 4.  Continue wearing good supportive shoes 5.  Compression ankle sleeve dispensed. 6.  Patient states that the OTC insoles aggravated her pain.  Discontinue. 7.  Return to clinic as needed   *Works at the [employer] and [employer] in [place], [place]      Felecia Shelling, [facility]  Dr. Felecia Shelling, DPM    [address] [place], [place] [address]                Office [phone]  Fax [phone]

## 2020-06-30 ENCOUNTER — Other Ambulatory Visit: Payer: Self-pay

## 2020-06-30 ENCOUNTER — Ambulatory Visit: Payer: BC Managed Care – PPO | Admitting: Podiatry

## 2020-06-30 DIAGNOSIS — M76822 Posterior tibial tendinitis, left leg: Secondary | ICD-10-CM | POA: Diagnosis not present

## 2020-07-06 NOTE — Progress Notes (Signed)
   HPI: 49 y.o. female presenting today for evaluation of a new complaint to pain and tenderness to the left lower extremity outside of the ankle.  She is having pain despite wearing her compression ankle sleeve.  It radiates up to the ankle.  She presents for further treatment evaluation.  She believes that now her right foot also is hurting and becoming this comfortable from all the pressure and compensation applied to it.  Past Medical History:  Diagnosis Date  . Asthma    seasonal  . Cyst of right breast   . IUD (intrauterine device) in place 11/21/2014  . OAB (overactive bladder) 11/21/2014  . Wheezing 04-Dec-2013   Smokes, dad died with lung cancer get chest xray     Physical Exam: General: The patient is alert and oriented x3 in no acute distress.  Dermatology: Skin is warm, dry and supple bilateral lower extremities. Negative for open lesions or macerations.  Vascular: Palpable pedal pulses bilaterally. No edema or erythema noted. Capillary refill within normal limits.  Neurological: Epicritic and protective threshold grossly intact bilaterally.   Musculoskeletal Exam: Range of motion within normal limits to all pedal and ankle joints bilateral. Muscle strength 5/5 in all groups bilateral.  Pain on palpation along the posterior tibial tendon left lower extremity as it traverses the medial malleolus  Assessment: 1.  Posterior tibial tendinitis left 2.  History of left ankle synovitis and midtarsal capsulitis left-resolved  Plan of Care:  1. Patient evaluated.  2.  Injection of 0.5 cc Celestone Soluspan injected along the posterior tibial tendon sheath left lower extremity 3.  Refill prescription for meloxicam 15 mg daily 4.  Ankle brace dispensed.  Wear daily.  Discontinue the compression anklet for the moment 5.  Continue wearing Ryka good supportive shoes 6.  Return to clinic in 4 weeks  *Works at [employer] in [place], [place]      Felecia Shelling,  [facility]  Dr. Felecia Shelling, DPM    [address] [place], [place] [address]                Office [phone]  Fax [phone]

## 2020-07-13 ENCOUNTER — Ambulatory Visit: Payer: BC Managed Care – PPO | Admitting: Family Medicine

## 2020-07-13 ENCOUNTER — Encounter: Payer: Self-pay | Admitting: Family Medicine

## 2020-07-13 ENCOUNTER — Other Ambulatory Visit: Payer: Self-pay

## 2020-07-13 VITALS — BP 120/82 | HR 69 | Temp 98.1°F | Ht 62.0 in | Wt 193.5 lb

## 2020-07-13 DIAGNOSIS — M25672 Stiffness of left ankle, not elsewhere classified: Secondary | ICD-10-CM

## 2020-07-13 DIAGNOSIS — M25671 Stiffness of right ankle, not elsewhere classified: Secondary | ICD-10-CM | POA: Diagnosis not present

## 2020-07-13 DIAGNOSIS — R29898 Other symptoms and signs involving the musculoskeletal system: Secondary | ICD-10-CM

## 2020-07-13 DIAGNOSIS — M76822 Posterior tibial tendinitis, left leg: Secondary | ICD-10-CM

## 2020-07-13 NOTE — Progress Notes (Signed)
Bonnie Martello T. Cassara [name], MD, CAQ Sports Medicine  Primary Care and Sports Medicine [facility] at [facility] [address] [place], [address]  Phone: [phone]  FAX: [phone]  Bonnie Murphy - 49 y.o. female  MRN [id number]  Date of Birth: Mar 15, 1971  Date: 07/13/2020  PCP: Lorre Munroe, NP  Referral: Lorre Munroe, NP  Chief Complaint  Patient presents with  . Foot Pain    Bilateral-been seeing Dr. Logan Bores Podiatrist    This visit occurred during the SARS-CoV-2 public health emergency.  Safety protocols were in place, including screening questions prior to the visit, additional usage of staff PPE, and extensive cleaning of exam room while observing appropriate contact time as indicated for disinfecting solutions.   Subjective:   Bonnie Murphy is a 49 y.o. very pleasant female patient with Body mass index is 35.39 kg/m. who presents with the following:  Patient is having and has been having bilateral foot and ankle pain for approximately 9 months.  She has had a number of different diagnoses depending on which provider she saw.  She initially was seen 1 podiatrist, and now she has been seeing Dr. Logan Bores fro tried foot.  Most recently she was diagnosed with some posterior tibialis tendinopathy, and she got a posterior tibialis corticosteroid injection on 06/30/2020.  03/24/2020 she received a celestone injection into the midtarsal joint.  She has also had oral steroids.  All in all, based on chart review as well as the patient's independent history, she has had 7 different corticosteroid injections in her foot over the last 9 months.  She is still having some significant pain, pain with ambulation and overall impairment.  She has done per her report no significant foot and ankle strengthening or range of motion work.  It looks as if she had a foot radiograph on February 28, 2020, but I cannot find a report in [place] [facility] that all.  Review of  Systems is noted in the HPI, as appropriate   Objective:   BP 120/82   Pulse 69   Temp 98.1 F (36.7 C) (Temporal)   Ht 5\' 2"  (1.575 m)   Wt 193 lb 8 oz (87.8 kg)   SpO2 100%   BMI 35.39 kg/m   Bilateral foot and ankle exam:  She is nontender along the tibia and fibula as well as the medial lateral malleoli.  With anterior drawer testing, the patient has significant increased translation compared to what is expected bilaterally.  Along the left posterior tibialis she is tender to palpation.  She is also significant pain with calf raise on 2 calf raises.  She has an obvious limp with a significantly antalgic gait.  On proprioceptive testing, the patient is unsteady bilaterally, and considerably worse on the left and she almost immediately falls and loses her balance with her eyes closed.  From a bony anatomy standpoint she is entirely nontender at the forefoot, and midfoot.  She does have some mild tenderness at the talus.  Radiology: No results found.  Assessment and Plan:     ICD-10-CM   1. Posterior tibial tendinitis of left leg  M76.822   2. Ankle weakness  R29.898   3. Ankle joint stiffness, bilateral  M25.671    M25.672    Total encounter time: 30 minutes. This includes total time spent on the day of encounter.  Extensive record review, gait analysis, anatomy review and rehab review  Exam is consistent with PT tendinopathy  on the left.  She is also weaker than expected and her range of motion is slightly decreased compared to the right side.  Her proprioception is poor at this point.  I do think that her risk of reinjury is going to be quite high without appropriate strengthening of the foot and proprioception work.  I reviewed a comprehensive rehab program from [facility] with her for posterior tibialis strengthening as well as foot and ankle strengthening and range of motion.  Advance this slowly.  Discontinue bracing as able. Also placed a sports insole with the  somewhat better arch support in her shoe, and hopefully this will unload the posterior tibialis as well and least mildly.  Follow-up with me in 1 month  She certainly can follow-up with podiatry, and Dr. Logan Bores is an excellent podiatrist.  My take and management are going to be somewhat different given my training and sports medicine, so we will try this for now, but ongoing management and follow-up with podiatry is also entirely appropriate  Signed,  Keishla Oyer T. Mehtab Dolberry, MD   Outpatient Encounter Medications as of 07/13/2020  Medication Sig  . albuterol (VENTOLIN HFA) 108 (90 Base) MCG/ACT inhaler INHALE 2 PUFFS BY MOUTH EVERY 6 HOURS AS NEEDED FOR WHEEZING FOR SHORTNESS OF BREATH  . ibuprofen (ADVIL) 200 MG tablet Take 800 mg by mouth 2 (two) times daily as needed.  Marland Kitchen levonorgestrel (LILETTA, 52 MG,) 19.5 MCG/DAY IUD IUD 1 each by Intrauterine route once.  . meloxicam (MOBIC) 15 MG tablet Take 1 tablet (15 mg total) by mouth daily.  . montelukast (SINGULAIR) 10 MG tablet Take 1 tablet (10 mg total) by mouth at bedtime.  . [DISCONTINUED] diclofenac (VOLTAREN) 75 MG EC tablet Take 1 tablet by mouth every 12 (twelve) hours.  . [DISCONTINUED] gentamicin cream (GARAMYCIN) 0.1 % Apply 1 application topically 2 (two) times daily.  . [DISCONTINUED] ibuprofen (ADVIL) 800 MG tablet Take 800 mg by mouth 3 (three) times daily.  . [DISCONTINUED] itraconazole (SPORANOX) 100 MG capsule Take 1 capsule (100 mg total) by mouth daily.  . [DISCONTINUED] methylPREDNISolone (MEDROL DOSEPAK) 4 MG TBPK tablet 6 day dose pack - take as directed  . [DISCONTINUED] terbinafine (LAMISIL) 250 MG tablet Take 1 tablet (250 mg total) by mouth daily.   No facility-administered encounter medications on file as of 07/13/2020.

## 2020-07-28 ENCOUNTER — Ambulatory Visit: Payer: BC Managed Care – PPO | Admitting: Podiatry

## 2020-08-05 ENCOUNTER — Other Ambulatory Visit: Payer: Self-pay | Admitting: Internal Medicine

## 2020-08-12 ENCOUNTER — Ambulatory Visit: Payer: BC Managed Care – PPO | Admitting: Family Medicine

## 2020-08-12 ENCOUNTER — Encounter: Payer: Self-pay | Admitting: Family Medicine

## 2020-08-12 ENCOUNTER — Ambulatory Visit: Payer: BC Managed Care – PPO | Admitting: Internal Medicine

## 2020-08-12 ENCOUNTER — Other Ambulatory Visit: Payer: Self-pay

## 2020-08-12 VITALS — BP 120/90 | HR 97 | Temp 98.2°F | Ht 62.0 in | Wt 189.5 lb

## 2020-08-12 DIAGNOSIS — M76822 Posterior tibial tendinitis, left leg: Secondary | ICD-10-CM | POA: Diagnosis not present

## 2020-08-12 DIAGNOSIS — M25672 Stiffness of left ankle, not elsewhere classified: Secondary | ICD-10-CM

## 2020-08-12 DIAGNOSIS — R29898 Other symptoms and signs involving the musculoskeletal system: Secondary | ICD-10-CM

## 2020-08-12 DIAGNOSIS — M25572 Pain in left ankle and joints of left foot: Secondary | ICD-10-CM

## 2020-08-12 DIAGNOSIS — G8929 Other chronic pain: Secondary | ICD-10-CM

## 2020-08-12 NOTE — Patient Instructions (Signed)
Aircast Airlift PTTD Ankle Support Brace, LEFT Foot

## 2020-08-12 NOTE — Progress Notes (Signed)
Rut Betterton T. Brin Ruggerio, MD, CAQ Sports Medicine  Primary Care and Sports Medicine [facility] at [facility] [address] [place], [address]  Phone: [phone]  FAX: [phone]  Bonnie Murphy - 49 y.o. female  MRN [id number]  Date of Birth: 04-13-1971  Date: 08/12/2020  PCP: Lorre Munroe, NP  Referral: Lorre Munroe, NP  Chief Complaint  Patient presents with  . Follow-up    Foot Pain-Left    This visit occurred during the SARS-CoV-2 public health emergency.  Safety protocols were in place, including screening questions prior to the visit, additional usage of staff PPE, and extensive cleaning of exam room while observing appropriate contact time as indicated for disinfecting solutions.   Subjective:   Bonnie Murphy is a 49 y.o. very pleasant female patient with Body mass index is 34.66 kg/m. who presents with the following:  F/u PT tendinopathy.  Chronic foot pain.  She has been followed by one of the podiatrist for about 8 or 9 months, and we did make some changes last time and start doing some home rehab.  This did help for a little while, but then it sounds as if when she was doing some of her very basic rehab she had some onset of pain.  She continues to have difficulty with basic walking at work and with pushoff.  L peroneal tendinopathy  07/13/2020 Last OV with Hannah Beat, MD p Patient is having and has been having bilateral foot and ankle pain for approximately 9 months.  She has had a number of different diagnoses depending on which provider she saw.  She initially was seen 1 podiatrist, and now she has been seeing Dr. Logan Bores fro tried foot.   Most recently she was diagnosed with some posterior tibialis tendinopathy, and she got a posterior tibialis corticosteroid injection on 06/30/2020.  03/24/2020 she received a celestone injection into the midtarsal joint.  She has also had oral steroids.  All in all, based on chart review as well  as the patient's independent history, she has had 7 different corticosteroid injections in her foot over the last 9 months.   She is still having some significant pain, pain with ambulation and overall impairment.   She has done per her report no significant foot and ankle strengthening or range of motion work.   It looks as if she had a foot radiograph on February 28, 2020, but I cannot find a report in [place] [facility] that all.     Review of Systems is noted in the HPI, as appropriate   Objective:   BP 120/90   Pulse 97   Temp 98.2 F (36.8 C) (Temporal)   Ht 5\' 2"  (1.575 m)   Wt 189 lb 8 oz (86 kg)   SpO2 97%   BMI 34.66 kg/m   Bilateral foot and ankle exam:  Nontender throughout the entirety of the tibia and fibula.  Malleoli are nontender.  She does have increased laxity with drawer testing anteriorly.    She is tender throughout the posterior tibialis.  Minimal at the peroneal tendon.  She also has significant pain with any form of calf raising at all.  She does have a very antalgic gait with obvious limp.  She is unsteady with standing on 1 foot of the proprioceptive testing.  Radiology: In our healthcare system the patient did have x-rays on February 28, 2020.  These are not available for my independent review.  She does  have per report on the podiatry notes some degenerative changes in the midfoot.  Assessment and Plan:     ICD-10-CM   1. Chronic pain of left ankle  M25.572 MR ANKLE LEFT WO CONTRAST   G89.29   2. Posterior tibial tendinitis of left leg  M76.822 MR ANKLE LEFT WO CONTRAST  3. Ankle weakness  R29.898 MR ANKLE LEFT WO CONTRAST  4. Ankle joint stiffness, left  M25.672 MR ANKLE LEFT WO CONTRAST   Greater than 10 months duration of chronic foot and ankle pain with failure of virtually all forms of conservative measures.  She has had extensive shoe modifications, many rounds of oral anti-inflammatories, oral steroids, multiple joint injections and tendon  sheath injections, and she still does poorly.  She is not improving at all.  Obtain an MRI of the left ankle to evaluate for OCD, chronic stress fracture or stress reaction, and to evaluate all tendinous structures.  Additional follow-up will be dictated by MRI findings.  I am also going to have her change her brace to an Aircast PTTD brace.  No orders of the defined types were placed in this encounter.  There are no discontinued medications. Orders Placed This Encounter  Procedures  . MR ANKLE LEFT WO CONTRAST    Signed,  Hyden Soley T. Esterlene Atiyeh, MD   Outpatient Encounter Medications as of 08/12/2020  Medication Sig  . albuterol (VENTOLIN HFA) 108 (90 Base) MCG/ACT inhaler INHALE 2 PUFFS BY MOUTH EVERY 6 HOURS AS NEEDED FOR WHEEZING FOR SHORTNESS OF BREATH  . ibuprofen (ADVIL) 200 MG tablet Take 800 mg by mouth 2 (two) times daily as needed.  Marland Kitchen levonorgestrel (LILETTA, 52 MG,) 19.5 MCG/DAY IUD IUD 1 each by Intrauterine route once.  . meloxicam (MOBIC) 15 MG tablet Take 1 tablet (15 mg total) by mouth daily.  . montelukast (SINGULAIR) 10 MG tablet Take 1 tablet (10 mg total) by mouth at bedtime.   No facility-administered encounter medications on file as of 08/12/2020.

## 2020-08-14 ENCOUNTER — Encounter: Payer: Self-pay | Admitting: Family Medicine

## 2020-08-14 ENCOUNTER — Telehealth: Payer: Self-pay

## 2020-08-14 NOTE — Telephone Encounter (Signed)
Patient returned call.  I gave patient the information.  Patient said she doesn't have any metal devices in her body, and no joint replacement surgery in the past. Patient voiced understanding.

## 2020-08-14 NOTE — Telephone Encounter (Signed)
Left message for patient to call back.  MRI scheduled for 08/26/2020 at 9 am at [facility]. Arrival time is 8:30 am. If need to reschedule patient can call [phone]. This was first available appointment at that location. Need to verify that patient does not have any metal devices in her body, any joint replacement surgery in the past.  Address: [address], [place], [place] [address]

## 2020-08-14 NOTE — Telephone Encounter (Signed)
Noted! Thank you

## 2020-08-26 ENCOUNTER — Other Ambulatory Visit: Payer: Self-pay

## 2020-08-26 ENCOUNTER — Ambulatory Visit (HOSPITAL_COMMUNITY)
Admission: RE | Admit: 2020-08-26 | Discharge: 2020-08-26 | Disposition: A | Discharge status: Home / Self Care | Payer: BC Managed Care – PPO | Source: Ambulatory Visit | Attending: Family Medicine | Admitting: Family Medicine

## 2020-08-26 DIAGNOSIS — G8929 Other chronic pain: Secondary | ICD-10-CM | POA: Diagnosis present

## 2020-08-26 DIAGNOSIS — M25572 Pain in left ankle and joints of left foot: Secondary | ICD-10-CM | POA: Insufficient documentation

## 2020-08-26 DIAGNOSIS — M76822 Posterior tibial tendinitis, left leg: Secondary | ICD-10-CM | POA: Diagnosis present

## 2020-08-26 DIAGNOSIS — R29898 Other symptoms and signs involving the musculoskeletal system: Secondary | ICD-10-CM | POA: Insufficient documentation

## 2020-08-26 DIAGNOSIS — M25672 Stiffness of left ankle, not elsewhere classified: Secondary | ICD-10-CM | POA: Diagnosis present

## 2020-08-31 ENCOUNTER — Encounter: Payer: Self-pay | Admitting: Family Medicine

## 2020-09-01 ENCOUNTER — Other Ambulatory Visit: Payer: Self-pay | Admitting: Family Medicine

## 2020-09-01 DIAGNOSIS — M84469A Pathological fracture, unspecified tibia and fibula, initial encounter for fracture: Secondary | ICD-10-CM

## 2020-09-01 NOTE — Telephone Encounter (Signed)
Will you help me do this tomorrow, and I will sign it

## 2020-09-29 ENCOUNTER — Other Ambulatory Visit (HOSPITAL_COMMUNITY): Payer: Self-pay | Admitting: Internal Medicine

## 2020-09-29 DIAGNOSIS — Z1231 Encounter for screening mammogram for malignant neoplasm of breast: Secondary | ICD-10-CM

## 2020-10-02 ENCOUNTER — Ambulatory Visit (HOSPITAL_COMMUNITY): Payer: BC Managed Care – PPO

## 2020-11-11 ENCOUNTER — Ambulatory Visit (HOSPITAL_COMMUNITY)
Admission: RE | Admit: 2020-11-11 | Discharge: 2020-11-11 | Disposition: A | Discharge status: Home / Self Care | Payer: BC Managed Care – PPO | Source: Ambulatory Visit | Attending: Internal Medicine | Admitting: Internal Medicine

## 2020-11-11 ENCOUNTER — Other Ambulatory Visit: Payer: Self-pay

## 2020-11-11 DIAGNOSIS — Z1231 Encounter for screening mammogram for malignant neoplasm of breast: Secondary | ICD-10-CM | POA: Diagnosis present

## 2021-01-08 ENCOUNTER — Other Ambulatory Visit: Payer: Self-pay

## 2021-01-08 ENCOUNTER — Encounter: Payer: Self-pay | Admitting: Family Medicine

## 2021-01-08 ENCOUNTER — Ambulatory Visit (INDEPENDENT_AMBULATORY_CARE_PROVIDER_SITE_OTHER): Payer: BC Managed Care – PPO | Admitting: Family Medicine

## 2021-01-08 VITALS — BP 128/70 | HR 83 | Temp 97.2°F | Ht 62.0 in | Wt 192.0 lb

## 2021-01-08 DIAGNOSIS — R011 Cardiac murmur, unspecified: Secondary | ICD-10-CM

## 2021-01-08 DIAGNOSIS — F17200 Nicotine dependence, unspecified, uncomplicated: Secondary | ICD-10-CM

## 2021-01-08 DIAGNOSIS — E785 Hyperlipidemia, unspecified: Secondary | ICD-10-CM | POA: Diagnosis not present

## 2021-01-08 DIAGNOSIS — Z0000 Encounter for general adult medical examination without abnormal findings: Secondary | ICD-10-CM

## 2021-01-08 DIAGNOSIS — Z7189 Other specified counseling: Secondary | ICD-10-CM

## 2021-01-08 DIAGNOSIS — E559 Vitamin D deficiency, unspecified: Secondary | ICD-10-CM

## 2021-01-08 MED ORDER — ALBUTEROL SULFATE HFA 108 (90 BASE) MCG/ACT IN AERS: INHALATION_SPRAY | RESPIRATORY_TRACT | 2 refills | Status: DC

## 2021-01-08 MED ORDER — MONTELUKAST SODIUM 10 MG PO TABS: 10.0000 mg | ORAL_TABLET | Freq: Every day | ORAL | 3 refills | Status: DC

## 2021-01-08 NOTE — Progress Notes (Signed)
This visit occurred during the SARS-CoV-2 public health emergency.  Safety protocols were in place, including screening questions prior to the visit, additional usage of staff PPE, and extensive cleaning of exam room while observing appropriate contact time as indicated for disinfecting solutions.  Smoking- "I enjoy it. I know I need to quit."  encouraged cessation.  wellbutrin prev helped but "I didn't feel right on it, more anxious."  Tried patch years ago, with irritability.  Off singulair.  Using SABA BID in the meantime, more in the springtime.  Using singular usually helps with SABA use.    IUD per gyn clinic.  Pap per gyn clinic.  She is due for follow up.  Discussed.  Mammogram 2022 Pap per gyn, d/w pt.  D/w patient WL:[id number] for colon cancer screening, including IFOB vs. colonoscopy.  Risks and benefits of both were discussed and patient voiced understanding.  Pt elects to check on cologuard. Husband designated if patient were incapacitated.   Lung cancer screening d/w pt.  She is interested.  Needs referral.   Vaccines d/w pt.   [facility] and HCV [facility].   Diet and exercise d/w pt.    H/o low vit D.  See notes on labs.  History of hyperlipidemia.  See notes on labs.  No history of MI but she reported history of cardiac murmur.  See exam and plan.  No syncope.  No known exercise limitation.  No chest pain.  Meds, vitals, and allergies reviewed.   ROS: Per HPI unless specifically indicated in ROS section   GEN: nad, alert and oriented HEENT: mucous membranes moist NECK: supple w/o LA CV: rrr.  Early soft systolic murmur heard over the aortic area PULM: Scant bilateral expiratory wheeze but otherwise clear, no inc wob ABD: soft, +bs EXT: no edema SKIN: no acute rash  See after visit summary.

## 2021-01-08 NOTE — Patient Instructions (Addendum)
Please call about seeing gynecology and ask them to send me a copy of your note.   Restart singulair.   Please check on cologuard coverage and let me know if want to do that.   Go to the lab on the way out.   If you have mychart we'll likely use that to update you.    Take care.  Glad to see you.  I'll check on the lung screening and echo.

## 2021-01-09 LAB — COMPREHENSIVE METABOLIC PANEL
AG Ratio: 1.8 (calc) (ref 1.0–2.5)
ALT: 25 U/L (ref 6–29)
AST: 18 U/L (ref 10–35)
Albumin: 4.4 g/dL (ref 3.6–5.1)
Alkaline phosphatase (APISO): 71 U/L (ref 37–153)
BUN: 18 mg/dL (ref 7–25)
CO2: 27 mmol/L (ref 20–32)
Calcium: 9.7 mg/dL (ref 8.6–10.4)
Chloride: 105 mmol/L (ref 98–110)
Creat: 0.72 mg/dL (ref 0.50–1.05)
Globulin: 2.5 g/dL (calc) (ref 1.9–3.7)
Glucose, Bld: 84 mg/dL (ref 65–99)
Potassium: 4.7 mmol/L (ref 3.5–5.3)
Sodium: 141 mmol/L (ref 135–146)
Total Bilirubin: 0.4 mg/dL (ref 0.2–1.2)
Total Protein: 6.9 g/dL (ref 6.1–8.1)

## 2021-01-09 LAB — LIPID PANEL
Cholesterol: 265 mg/dL — ABNORMAL HIGH (ref <200)
HDL: 48 mg/dL — ABNORMAL LOW (ref >=50)
LDL Cholesterol (Calc): 182 mg/dL (calc) — ABNORMAL HIGH
Non-HDL Cholesterol (Calc): 217 mg/dL (calc) — ABNORMAL HIGH (ref <130)
Total CHOL/HDL Ratio: 5.5 (calc) — ABNORMAL HIGH (ref <5.0)
Triglycerides: 193 mg/dL — ABNORMAL HIGH (ref <150)

## 2021-01-09 LAB — CBC WITH DIFFERENTIAL/PLATELET
Absolute Monocytes: 506 cells/uL (ref 200–950)
Basophils Absolute: 44 cells/uL (ref 0–200)
Basophils Relative: 0.4 %
Eosinophils Absolute: 198 cells/uL (ref 15–500)
Eosinophils Relative: 1.8 %
HCT: 44 % (ref 35.0–45.0)
Hemoglobin: 14.8 g/dL (ref 11.7–15.5)
Lymphs Abs: 4730 cells/uL — ABNORMAL HIGH (ref 850–3900)
MCH: 29.4 pg (ref 27.0–33.0)
MCHC: 33.6 g/dL (ref 32.0–36.0)
MCV: 87.5 fL (ref 80.0–100.0)
MPV: 9.8 fL (ref 7.5–12.5)
Monocytes Relative: 4.6 %
Neutro Abs: 5522 cells/uL (ref 1500–7800)
Neutrophils Relative %: 50.2 %
Platelets: 298 10*3/uL (ref 140–400)
RBC: 5.03 10*6/uL (ref 3.80–5.10)
RDW: 13.3 % (ref 11.0–15.0)
Total Lymphocyte: 43 %
WBC: 11 10*3/uL — ABNORMAL HIGH (ref 3.8–10.8)

## 2021-01-09 LAB — VITAMIN D 25 HYDROXY (VIT D DEFICIENCY, FRACTURES): Vit D, 25-Hydroxy: 54 ng/mL (ref 30–100)

## 2021-01-10 DIAGNOSIS — E559 Vitamin D deficiency, unspecified: Secondary | ICD-10-CM | POA: Insufficient documentation

## 2021-01-10 DIAGNOSIS — F17200 Nicotine dependence, unspecified, uncomplicated: Secondary | ICD-10-CM | POA: Insufficient documentation

## 2021-01-10 DIAGNOSIS — Z0000 Encounter for general adult medical examination without abnormal findings: Secondary | ICD-10-CM | POA: Insufficient documentation

## 2021-01-10 DIAGNOSIS — R011 Cardiac murmur, unspecified: Secondary | ICD-10-CM | POA: Insufficient documentation

## 2021-01-10 DIAGNOSIS — Z7189 Other specified counseling: Secondary | ICD-10-CM | POA: Insufficient documentation

## 2021-01-10 DIAGNOSIS — E785 Hyperlipidemia, unspecified: Secondary | ICD-10-CM | POA: Insufficient documentation

## 2021-01-10 NOTE — Assessment & Plan Note (Signed)
See notes on labs. 

## 2021-01-10 NOTE — Assessment & Plan Note (Signed)
Echo ordered.

## 2021-01-10 NOTE — Assessment & Plan Note (Signed)
IUD per gyn clinic.  Pap per gyn clinic.  She is due for follow up.  Discussed.  Mammogram 2022 Pap per gyn, d/w pt.  D/w patient [id number] for colon cancer screening, including IFOB vs. colonoscopy.  Risks and benefits of both were discussed and patient voiced understanding.  Pt elects to check on cologuard. Husband designated if patient were incapacitated.   Lung cancer screening d/w pt.  She is interested.  Needs referral.   Vaccines d/w pt.   [facility].   Diet and exercise d/w pt.

## 2021-01-10 NOTE — Assessment & Plan Note (Signed)
Husband designated if patient were incapacitated. 

## 2021-01-10 NOTE — Assessment & Plan Note (Addendum)
Encouraged cessation.  Restart Singulair.  Use albuterol as needed.  If wheezing does not resolve with addition of Singulair then she will update me.  Refer for lung cancer screening.

## 2021-01-13 ENCOUNTER — Other Ambulatory Visit: Payer: Self-pay | Admitting: Family Medicine

## 2021-01-13 DIAGNOSIS — E785 Hyperlipidemia, unspecified: Secondary | ICD-10-CM

## 2021-02-02 ENCOUNTER — Ambulatory Visit (HOSPITAL_COMMUNITY)
Admission: RE | Admit: 2021-02-02 | Discharge: 2021-02-02 | Disposition: A | Discharge status: Home / Self Care | Payer: BC Managed Care – PPO | Source: Ambulatory Visit | Attending: Family Medicine | Admitting: Family Medicine

## 2021-02-02 ENCOUNTER — Other Ambulatory Visit: Payer: Self-pay

## 2021-02-02 DIAGNOSIS — R011 Cardiac murmur, unspecified: Secondary | ICD-10-CM | POA: Diagnosis present

## 2021-02-02 DIAGNOSIS — I350 Nonrheumatic aortic (valve) stenosis: Secondary | ICD-10-CM | POA: Diagnosis not present

## 2021-02-02 DIAGNOSIS — I351 Nonrheumatic aortic (valve) insufficiency: Secondary | ICD-10-CM | POA: Diagnosis not present

## 2021-02-02 LAB — ECHOCARDIOGRAM COMPLETE
AR max vel: 1.04 cm2
AV Area VTI: 1.17 cm2
AV Area mean vel: 1.22 cm2
AV Mean grad: 16 mmHg
AV Peak grad: 31.8 mmHg
Ao pk vel: 2.82 m/s
Area-P 1/2: 4.04 cm2
S' Lateral: 2.3 cm

## 2021-02-02 NOTE — Progress Notes (Signed)
*  PRELIMINARY RESULTS* Echocardiogram 2D Echocardiogram has been performed.  Stacey Drain 02/02/2021, 11:26 AM

## 2021-02-03 ENCOUNTER — Encounter: Payer: Self-pay | Admitting: Family Medicine

## 2021-02-03 ENCOUNTER — Other Ambulatory Visit: Payer: Self-pay | Admitting: Family Medicine

## 2021-02-03 DIAGNOSIS — I350 Nonrheumatic aortic (valve) stenosis: Secondary | ICD-10-CM | POA: Insufficient documentation

## 2021-03-15 ENCOUNTER — Encounter (HOSPITAL_COMMUNITY): Payer: Self-pay

## 2021-03-15 NOTE — Progress Notes (Signed)
Received callback from patient, she declines lung cancer screening at this time. Referring provider made aware.

## 2021-03-15 NOTE — Progress Notes (Signed)
I have attempted to contact the patient regarding lung cancer screening, unable to reach patient at this time. Detailed VM left asking the patient to return my call.

## 2021-03-21 ENCOUNTER — Telehealth: Payer: Self-pay | Admitting: Family Medicine

## 2021-03-21 NOTE — Telephone Encounter (Signed)
Noted. Thanks.

## 2021-03-21 NOTE — Telephone Encounter (Signed)
-----   Message from Therese Sarah, RN sent at 03/15/2021  1:53 PM EDT ----- Hi Dr. Para March,  Just letting you know that I have reached out to Ms. Tinner and she declines lung cancer screening at this time. Should she change her mind, please let us know!  Thanks, Lequita Halt

## 2021-04-14 ENCOUNTER — Other Ambulatory Visit: Payer: BC Managed Care – PPO

## 2021-04-16 ENCOUNTER — Other Ambulatory Visit: Payer: Self-pay

## 2021-04-16 ENCOUNTER — Other Ambulatory Visit (INDEPENDENT_AMBULATORY_CARE_PROVIDER_SITE_OTHER): Payer: BC Managed Care – PPO

## 2021-04-16 DIAGNOSIS — E785 Hyperlipidemia, unspecified: Secondary | ICD-10-CM | POA: Diagnosis not present

## 2021-04-16 LAB — LIPID PANEL
Cholesterol: 192 mg/dL (ref 0–200)
HDL: 40.8 mg/dL (ref >39.00)
LDL Cholesterol: 124 mg/dL — ABNORMAL HIGH (ref 0–99)
NonHDL: 151.36
Total CHOL/HDL Ratio: 5
Triglycerides: 138 mg/dL (ref 0.0–149.0)
VLDL: 27.6 mg/dL (ref 0.0–40.0)

## 2021-04-29 ENCOUNTER — Other Ambulatory Visit: Payer: Self-pay

## 2021-04-29 ENCOUNTER — Ambulatory Visit: Payer: BC Managed Care – PPO | Admitting: Internal Medicine

## 2021-04-29 VITALS — BP 120/70 | HR 72 | Ht 62.0 in | Wt 174.0 lb

## 2021-04-29 DIAGNOSIS — I350 Nonrheumatic aortic (valve) stenosis: Secondary | ICD-10-CM

## 2021-04-29 DIAGNOSIS — Z136 Encounter for screening for cardiovascular disorders: Secondary | ICD-10-CM

## 2021-04-29 NOTE — Patient Instructions (Signed)
Medication Instructions:  Your physician recommends that you continue on your current medications as directed. Please refer to the Current Medication list given to you today.  *If you need a refill on your cardiac medications before your next appointment, please call your pharmacy*   Lab Work: NONE   If you have labs (blood work) drawn today and your tests are completely normal, you will receive your results only by: MyChart Message (if you have MyChart) OR A paper copy in the mail If you have any lab test that is abnormal or we need to change your treatment, we will call you to review the results.   Testing/Procedures: Your physician has requested that you have an echocardiogram. Echocardiography is a painless test that uses sound waves to create images of your heart. It provides your doctor with information about the size and shape of your heart and how well your heart's chambers and valves are working. This procedure takes approximately one hour. There are no restrictions for this procedure.    Follow-Up: At [facility], you and your health needs are our priority.  As part of our continuing mission to provide you with exceptional heart care, we have created designated Provider Care Teams.  These Care Teams include your primary Cardiologist (physician) and Advanced Practice Providers (APPs -  Physician Assistants and Nurse Practitioners) who all work together to provide you with the care you need, when you need it.  We recommend signing up for the patient portal called "MyChart".  Sign up information is provided on this After Visit Summary.  MyChart is used to connect with patients for Virtual Visits (Telemedicine).  Patients are able to view lab/test results, encounter notes, upcoming appointments, etc.  Non-urgent messages can be sent to your provider as well.   To learn more about what you can do with MyChart, go to [email or web address].    Your next appointment:    June   The  format for your next appointment:   In Person  Provider:   Dietrich Pates, MD   Other Instructions Thank you for choosing  [facility]!

## 2021-04-29 NOTE — Progress Notes (Addendum)
Cardiology Office Note   Date:  04/29/2021   ID:  Bonnie Murphy, DOB March 05, 1971, MRN [id number]  PCP:  Joaquim Nam, MD  Cardiologist:   Dietrich Pates, MD   Patient presents for eval of murmur     History of Present Illness: Bonnie Murphy is a 50 y.o. female  who is followed by Linton Rump she had a murmur about 1 year ago     She is very active  Works at [employer] is OK  Denies CP    No dizziness  No edema  No palpitations      Current Meds  Medication Sig   albuterol (VENTOLIN HFA) 108 (90 Base) MCG/ACT inhaler INHALE 2 PUFFS BY MOUTH EVERY 6 HOURS AS NEEDED FOR WHEEZING FOR SHORTNESS OF BREATH   ibuprofen (ADVIL) 200 MG tablet Take 800 mg by mouth 2 (two) times daily as needed.   levonorgestrel (LILETTA, 52 MG,) 19.5 MCG/DAY IUD IUD 1 each by Intrauterine route once.   montelukast (SINGULAIR) 10 MG tablet Take 1 tablet (10 mg total) by mouth at bedtime.     Allergies:   Patient has no known allergies.   Past Medical History:  Diagnosis Date   Asthma    seasonal   Cyst of right breast    IUD (intrauterine device) in place 11/21/2014   OAB (overactive bladder) 11/21/2014   Wheezing 11/15/2013    Past Surgical History:  Procedure Laterality Date   CESAREAN SECTION       Social History:  The patient  reports that she has been smoking cigarettes. She has a 31.00 pack-year smoking history. She has never used smokeless tobacco. She reports current alcohol use. She reports that she does not use drugs.   Family History:  The patient's family history includes Cirrhosis in her father and paternal grandmother; Diabetes in her mother; Lung cancer in her father, mother, paternal aunt, and paternal uncle.    ROS:  Please see the history of present illness. All other systems are reviewed and  Negative to the above problem except as noted.    PHYSICAL EXAM: VS:  BP 120/70 (BP Location: Left Arm, Patient Position: Sitting, Cuff Size: Normal)   Pulse 72   Ht 5\' 2"   (1.575 m)   Wt 174 lb (78.9 kg)   SpO2 96%   BMI 31.83 kg/m   GEN: Well nourished, well developed, in no acute distress  HEENT: normal  Neck: no JVD, carotid bruits, or masses Cardiac: RRR; no murmurs, rubs, or gallops,no edema  Respiratory:  clear to auscultation bilaterally, normal work of breathing GI: soft, nontender, nondistended, + BS  No hepatomegaly  MS: no deformity Moving all extremities   Skin: warm and dry, no rash Neuro:  Strength and sensation are intact Psych: euthymic mood, full affect   EKG:  EKG is ordered today.  SR 72 bpm     Echo   02/02/21  1. Left ventricular ejection fraction, by estimation, is 60 to 65%. The left ventricle has normal function. The left ventricle has no regional wall motion abnormalities. There is mild left ventricular hypertrophy. Left ventricular diastolic parameters were normal. 2. Right ventricular systolic function is normal. The right ventricular size is normal. 3. The mitral valve is normal in structure. No evidence of mitral valve regurgitation. No evidence of mitral stenosis. 4. The aortic valve has an indeterminant number of cusps. There is mild calcification of the aortic valve. There is mild thickening of  the aortic valve. Aortic valve regurgitation is mild. Mild to moderate aortic valve stenosis. Aortic valve mean gradient measures 16.0 mmHg. Aortic valve peak gradient measures 31.8 mmHg. Aortic valve area, by VTI measures 1.17 cm. 5. The inferior vena cava is normal in size with greater than 50% respiratory variability, suggesting right atrial pressure of 3 mmHg.  Lipid Panel    Component Value Date/Time   CHOL 192 04/16/2021 0815   CHOL 180 11/21/2014 0910   TRIG 138.0 04/16/2021 0815   HDL 40.80 04/16/2021 0815   HDL 43 11/21/2014 0910   CHOLHDL 5 04/16/2021 0815   VLDL 27.6 04/16/2021 0815   LDLCALC 124 (H) 04/16/2021 0815   LDLCALC 182 (H) 01/08/2021 1628      Wt Readings from Last 3 Encounters:  04/29/21  174 lb (78.9 kg)  01/08/21 192 lb (87.1 kg)  08/12/20 189 lb 8 oz (86 kg)      ASSESSMENT AND PLAN:  1  Aortic stenosis    Reviewed echo images with Pt   Difficult to see but ? If functionally bicuspid.    Appears to be mildly stenotic.   Symptom free I would recomm follow up echo in 1 year    Stay active     Current medicines are reviewed at length with the patient today.  The patient does not have concerns regarding medicines.  Signed, Dietrich Pates, MD  04/29/2021 1:41 PM    [facility] [address] [place], [place], [place]  [address] Phone: [phone]; Fax: [phone]

## 2021-07-22 ENCOUNTER — Encounter: Payer: Self-pay | Admitting: Family Medicine

## 2021-07-22 ENCOUNTER — Telehealth (INDEPENDENT_AMBULATORY_CARE_PROVIDER_SITE_OTHER): Payer: BC Managed Care – PPO | Admitting: Family Medicine

## 2021-07-22 DIAGNOSIS — J0110 Acute frontal sinusitis, unspecified: Secondary | ICD-10-CM

## 2021-07-22 DIAGNOSIS — J019 Acute sinusitis, unspecified: Secondary | ICD-10-CM | POA: Insufficient documentation

## 2021-07-22 MED ORDER — AMOXICILLIN-POT CLAVULANATE 875-125 MG PO TABS: 1.0000 | ORAL_TABLET | Freq: Two times a day (BID) | ORAL | 0 refills | Status: DC

## 2021-07-22 NOTE — Assessment & Plan Note (Signed)
3 weeks s/p viral uri in smoker  Discussed sympt care  Not much cough and no addl wheezing but will watch for it  Px augmentin bid 7d Consider prednisone if she develops wheeze or does not improve Suggest steroid ns like flonase if needed Also saline Fluids/steam prn  ER precautions discussed Update if not starting to improve in a week or if worsening

## 2021-07-22 NOTE — Patient Instructions (Signed)
Drink fluids  Steam and nasal saline help congestion  Flonase nasal spray otc may help also Take augmentin as directed for sinus infection  Update if not starting to improve in a week or if worsening    If you develop any severe symptoms or have trouble breathing, alert [place] and go to the ER   Consider quitting smoking and let us know if you need help It is a good time to cut down or quit when you are sick

## 2021-07-22 NOTE — Progress Notes (Signed)
Virtual Visit via Video Note  I connected with [name] on 07/22/21 at  8:00 AM EST by a video enabled telemedicine application and verified that I am speaking with the correct person using two identifiers.  Location: Patient: work Provider: office   I discussed the limitations of evaluation and management by telemedicine and the availability of in person appointments. The patient expressed understanding and agreed to proceed.  Parties involved in encounter  Patient: Bonnie Murphy  Provider:  Roxy Manns MD   History of Present Illness: 50 yo pt of Dr Para March presents with sinus problems   3 wk of symptoms  Runny nose-started  Got better and then got worse  Congestion Green mucous on and off  Stopped up ears    No fever  Headache -face/above eyes  Not tender  No ST  Is hoarse  Just a little cough / prod green mucous  No more wheezing than usual   No body aches  Current smoker 1 ppd  Albuterol mdi- has not needed in the past 2 weeks  Singulair 10 mg daily  Son had uri and husband also   Did not do a covid  No GI symptoms   Advil cold and sinus   Patient Active Problem List   Diagnosis Date Noted   Acute sinusitis 07/22/2021   Aortic stenosis 02/03/2021   Routine general medical examination at a health care facility 01/10/2021   Advance care planning 01/10/2021   Vitamin D deficiency 01/10/2021   Hyperlipidemia 01/10/2021   Murmur 01/10/2021   Smoking 01/10/2021   Extrinsic asthma 10/29/2019   OAB (overactive bladder) 11/21/2014   Past Medical History:  Diagnosis Date   Asthma    seasonal   Cyst of right breast    IUD (intrauterine device) in place 11/21/2014   OAB (overactive bladder) 11/21/2014   Wheezing 11/15/2013   Past Surgical History:  Procedure Laterality Date   CESAREAN SECTION     Social History   Tobacco Use   Smoking status: Every Day    Packs/day: 1.00    Years: 31.00    Pack years: 31.00    Types: Cigarettes   Smokeless  tobacco: Never  Vaping Use   Vaping Use: Never used  Substance Use Topics   Alcohol use: Yes    Comment: rarely   Drug use: No   Family History  Problem Relation Age of Onset   Diabetes Mother    Lung cancer Mother    Cirrhosis Father    Lung cancer Father    Lung cancer Paternal Aunt    Lung cancer Paternal Uncle    Cirrhosis Paternal Grandmother    Colon cancer Neg Hx    Breast cancer Neg Hx    No Known Allergies Current Outpatient Medications on File Prior to Visit  Medication Sig Dispense Refill   albuterol (VENTOLIN HFA) 108 (90 Base) MCG/ACT inhaler INHALE 2 PUFFS BY MOUTH EVERY 6 HOURS AS NEEDED FOR WHEEZING FOR SHORTNESS OF BREATH 8 g 2   ibuprofen (ADVIL) 200 MG tablet Take 800 mg by mouth 2 (two) times daily as needed.     levonorgestrel (LILETTA, 52 MG,) 19.5 MCG/DAY IUD IUD 1 each by Intrauterine route once.     montelukast (SINGULAIR) 10 MG tablet Take 1 tablet (10 mg total) by mouth at bedtime. 90 tablet 3   No current facility-administered medications on file prior to visit.   Review of Systems  Constitutional:  Negative for chills, fever and malaise/fatigue.  HENT:  Positive for congestion and sinus pain. Negative for ear pain and sore throat.   Eyes:  Negative for blurred vision, discharge and redness.  Respiratory:  Positive for cough, sputum production and wheezing. Negative for shortness of breath and stridor.        No more wheeze than usual   Cardiovascular:  Negative for chest pain, palpitations and leg swelling.  Gastrointestinal:  Negative for abdominal pain, diarrhea, nausea and vomiting.  Musculoskeletal:  Negative for myalgias.  Skin:  Negative for rash.  Neurological:  Positive for headaches. Negative for dizziness.   Observations/Objective: Patient appears well, in no distress Weight is baseline  No facial swelling or asymmetry No tenderness over sinuses with self palpation  Mildly hoarse voice No obvious tremor or mobility  impairment Moving neck and UEs normally Able to hear the call well  No cough or shortness of breath during interview  She clears throat occ Talkative and mentally sharp with no cognitive changes No skin changes on face or neck , no rash or pallor Affect is normal    Assessment and Plan: Problem List Items Addressed This Visit       Respiratory   Acute sinusitis    3 weeks s/p viral uri in smoker  Discussed sympt care  Not much cough and no addl wheezing but will watch for it  Px augmentin bid 7d Consider prednisone if she develops wheeze or does not improve Suggest steroid ns like flonase if needed Also saline Fluids/steam prn  ER precautions discussed Update if not starting to improve in a week or if worsening        Relevant Medications   amoxicillin-clavulanate (AUGMENTIN) 875-125 MG tablet     Follow Up Instructions: Drink fluids  Steam and nasal saline help congestion  Flonase nasal spray otc may help also Take augmentin as directed for sinus infection  Update if not starting to improve in a week or if worsening    If you develop any severe symptoms or have trouble breathing, alert [place] and go to the ER   Consider quitting smoking and let us know if you need help It is a good time to cut down or quit when you are sick   I discussed the assessment and treatment plan with the patient. The patient was provided an opportunity to ask questions and all were answered. The patient agreed with the plan and demonstrated an understanding of the instructions.   The patient was advised to call back or seek an in-person evaluation if the symptoms worsen or if the condition fails to improve as anticipated.     Roxy Manns, MD

## 2021-08-17 ENCOUNTER — Other Ambulatory Visit: Payer: Self-pay | Admitting: Family Medicine

## 2021-10-25 IMAGING — MG MM DIGITAL SCREENING BILAT W/ TOMO AND CAD
8 series · 8 of 24 positions shown · non-contrast
Comparison: Previous exam(s).

CLINICAL DATA: Screening.

EXAM:
DIGITAL SCREENING BILATERAL MAMMOGRAM WITH TOMOSYNTHESIS AND CAD
TECHNIQUE: Bilateral screening digital craniocaudal and mediolateral oblique
mammograms were obtained. Bilateral screening digital breast
tomosynthesis was performed. The images were evaluated with
computer-aided detection.

[R CC synth-2D]
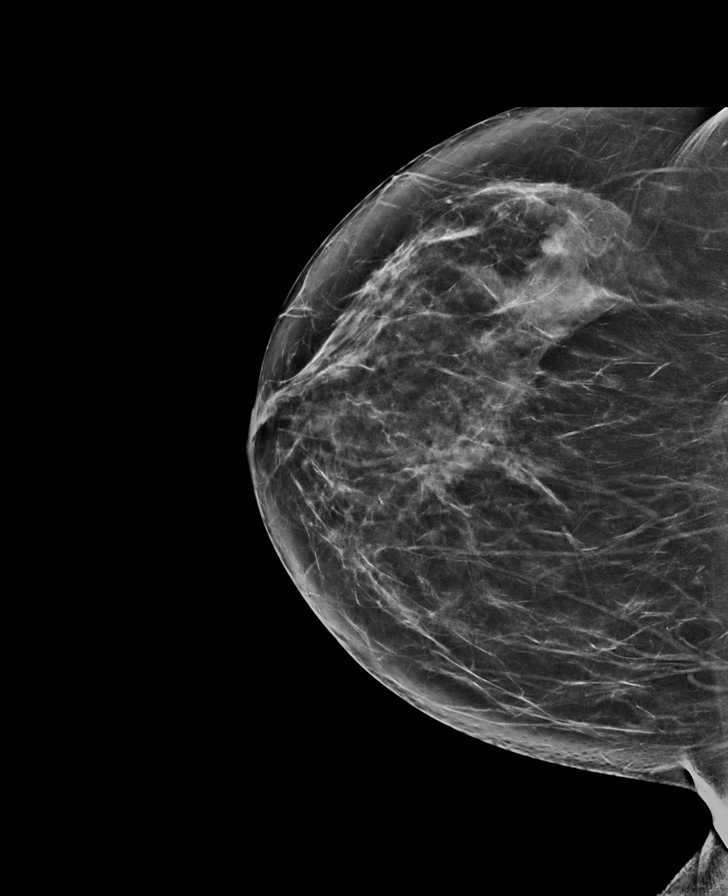

[L MLO synth-2D]
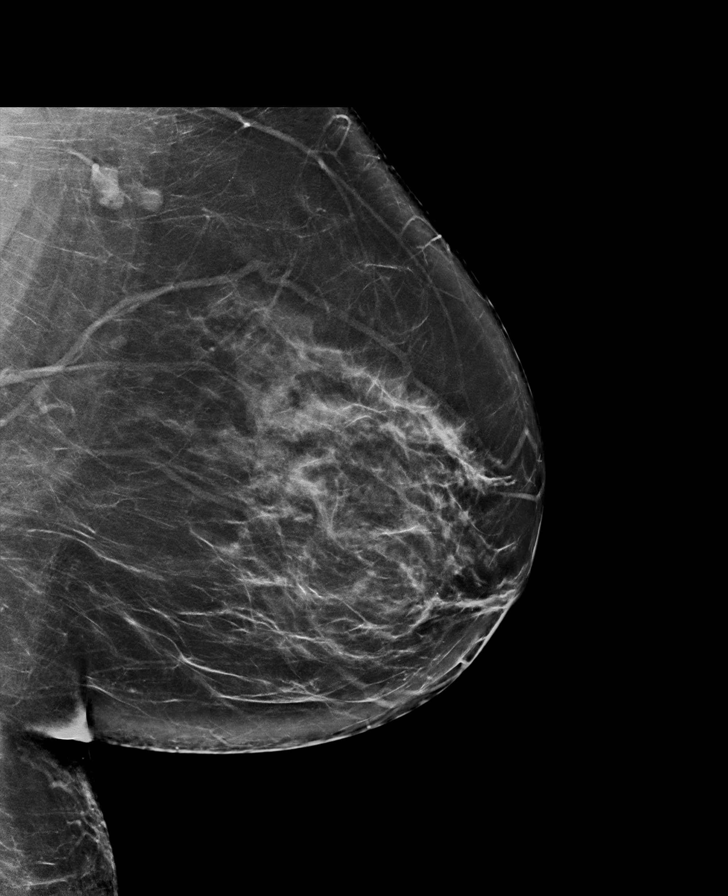

[R MLO synth-2D]
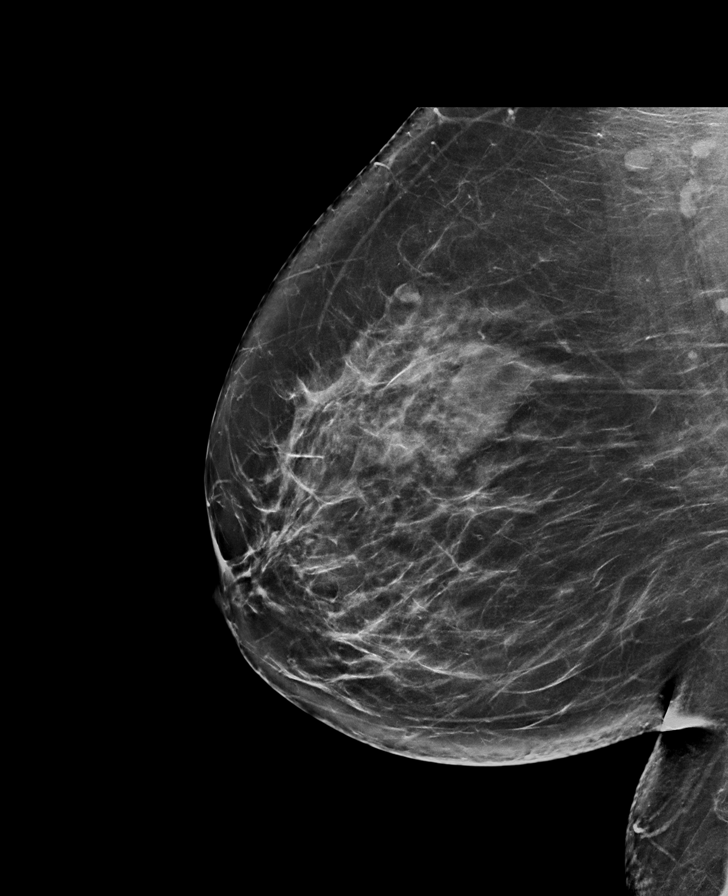

[L CC synth-2D]
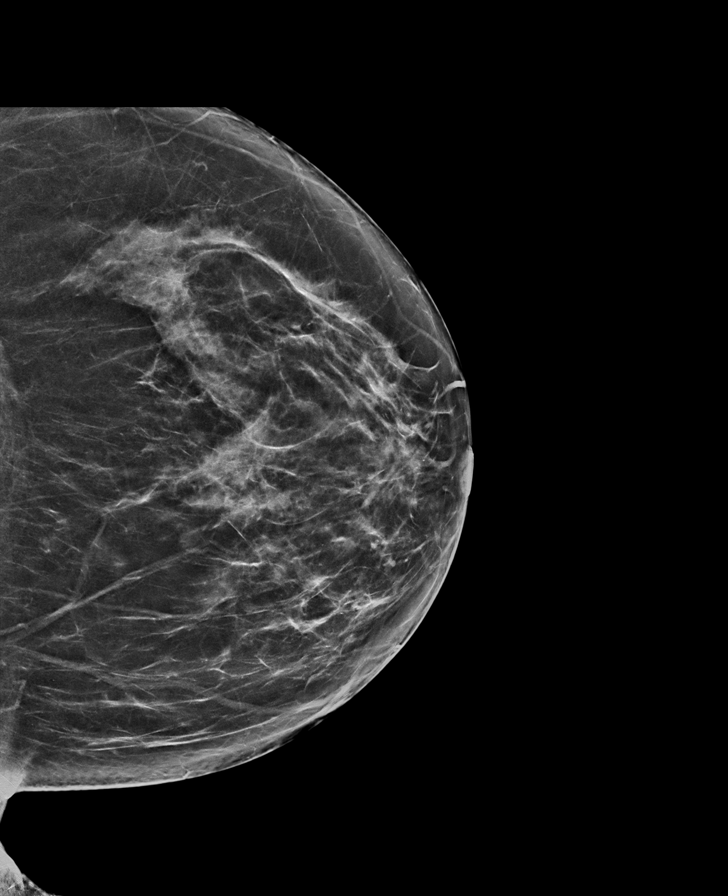

[R MLO tomo · tomo slice 41/82.0]
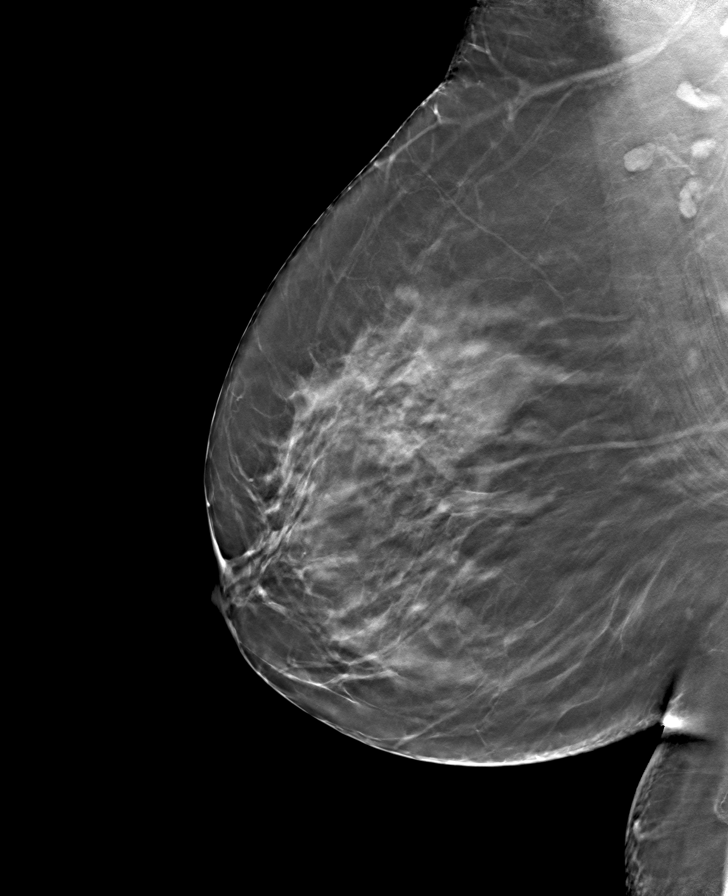

[L MLO tomo · tomo slice 45/90.0]
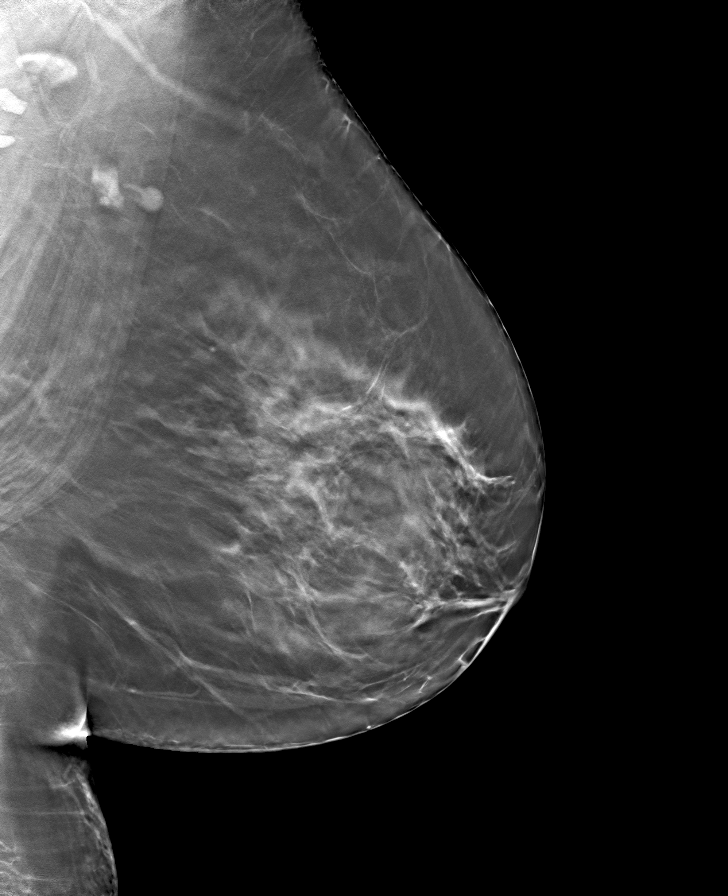

[L CC tomo · tomo slice 41/80.0]
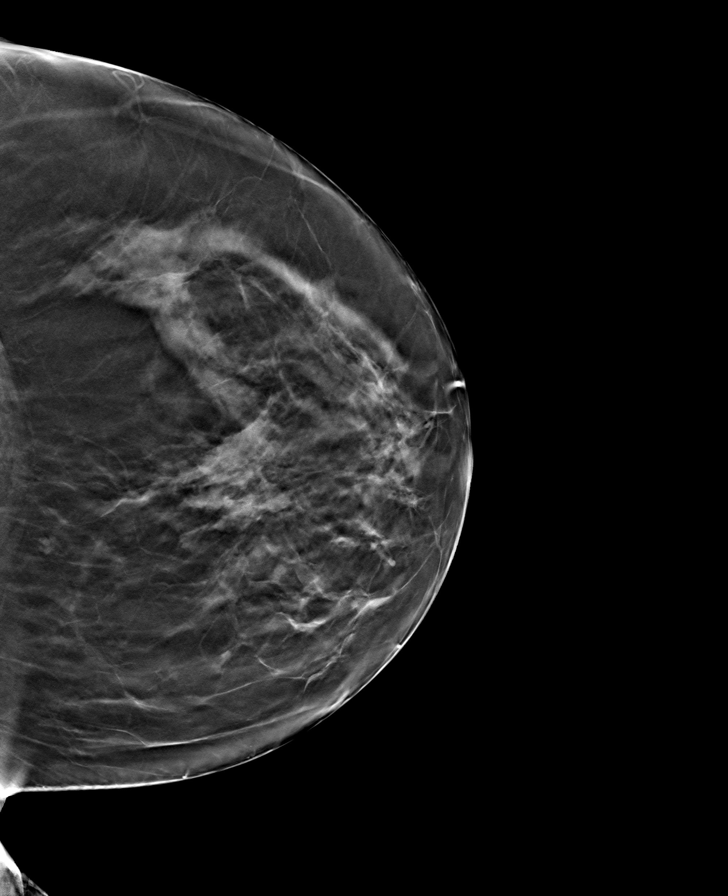

[R CC tomo · tomo slice 39/78.0]
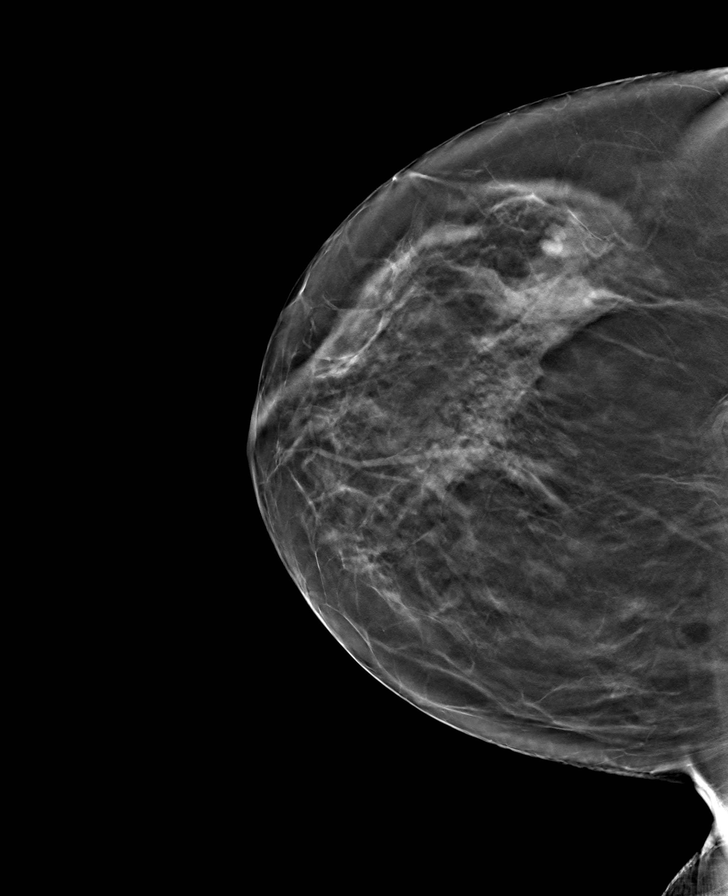

[8 of 24 positions shown; findings below may reference images not displayed]

ACR Breast Density Category c: The breast tissue is heterogeneously
dense, which may obscure small masses.
FINDINGS: There are no findings suspicious for malignancy. The images were
evaluated with computer-aided detection.
IMPRESSION: No mammographic evidence of malignancy. A result letter of this
screening mammogram will be mailed directly to the patient.

RECOMMENDATION:
Screening mammogram in one year. (Code:T4-5-GWO)

BI-RADS CATEGORY  1: Negative.

## 2021-11-12 ENCOUNTER — Other Ambulatory Visit (HOSPITAL_COMMUNITY): Payer: Self-pay | Admitting: Family Medicine

## 2021-11-12 DIAGNOSIS — Z1231 Encounter for screening mammogram for malignant neoplasm of breast: Secondary | ICD-10-CM

## 2021-11-22 ENCOUNTER — Ambulatory Visit (HOSPITAL_COMMUNITY): Payer: BC Managed Care – PPO

## 2021-11-25 ENCOUNTER — Ambulatory Visit (HOSPITAL_COMMUNITY)
Admission: RE | Admit: 2021-11-25 | Discharge: 2021-11-25 | Disposition: A | Discharge status: Home / Self Care | Payer: BC Managed Care – PPO | Source: Ambulatory Visit | Attending: Family Medicine | Admitting: Family Medicine

## 2021-11-25 ENCOUNTER — Other Ambulatory Visit: Payer: Self-pay

## 2021-11-25 DIAGNOSIS — Z1231 Encounter for screening mammogram for malignant neoplasm of breast: Secondary | ICD-10-CM | POA: Diagnosis present

## 2021-11-29 ENCOUNTER — Other Ambulatory Visit: Payer: Self-pay | Admitting: Internal Medicine

## 2022-01-21 ENCOUNTER — Other Ambulatory Visit (HOSPITAL_COMMUNITY): Payer: BC Managed Care – PPO

## 2022-02-10 ENCOUNTER — Ambulatory Visit (HOSPITAL_COMMUNITY)
Admission: RE | Admit: 2022-02-10 | Discharge: 2022-02-10 | Disposition: A | Discharge status: Home / Self Care | Payer: BC Managed Care – PPO | Source: Ambulatory Visit | Attending: Internal Medicine | Admitting: Internal Medicine

## 2022-02-10 DIAGNOSIS — I350 Nonrheumatic aortic (valve) stenosis: Secondary | ICD-10-CM | POA: Diagnosis not present

## 2022-02-10 LAB — ECHOCARDIOGRAM COMPLETE
AR max vel: 0.96 cm2
AV Area VTI: 1.01 cm2
AV Area mean vel: 1.01 cm2
AV Mean grad: 18 mmHg
AV Peak grad: 32.9 mmHg
Ao pk vel: 2.87 m/s
Area-P 1/2: 2.76 cm2
S' Lateral: 2.3 cm

## 2022-02-10 NOTE — Progress Notes (Signed)
*  PRELIMINARY RESULTS* Echocardiogram 2D Echocardiogram has been performed.  Bonnie Murphy 02/10/2022, 3:38 PM

## 2022-02-18 ENCOUNTER — Telehealth: Payer: Self-pay | Admitting: Internal Medicine

## 2022-02-18 NOTE — Telephone Encounter (Signed)
Patient is returning CMA's call regarding her Echo results.

## 2022-02-18 NOTE — Telephone Encounter (Signed)
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

## 2022-02-18 NOTE — Telephone Encounter (Signed)
Follow Up:    Patient is returning Ashley's calll.

## 2022-02-21 ENCOUNTER — Encounter: Payer: Self-pay | Admitting: *Deleted

## 2022-02-22 NOTE — Telephone Encounter (Signed)
Patient notified and verbalized understanding. Patient had no questions or concerns at this time.  

## 2022-05-16 ENCOUNTER — Encounter: Payer: BC Managed Care – PPO | Admitting: Family Medicine

## 2022-05-20 ENCOUNTER — Ambulatory Visit (INDEPENDENT_AMBULATORY_CARE_PROVIDER_SITE_OTHER)
Admission: RE | Admit: 2022-05-20 | Discharge: 2022-05-20 | Disposition: A | Discharge status: Home / Self Care | Payer: BC Managed Care – PPO | Source: Ambulatory Visit | Attending: Family Medicine | Admitting: Family Medicine

## 2022-05-20 ENCOUNTER — Encounter: Payer: Self-pay | Admitting: Family Medicine

## 2022-05-20 ENCOUNTER — Ambulatory Visit (INDEPENDENT_AMBULATORY_CARE_PROVIDER_SITE_OTHER): Payer: BC Managed Care – PPO | Admitting: Family Medicine

## 2022-05-20 VITALS — BP 106/76 | HR 65 | Temp 97.9°F | Ht 62.0 in | Wt 180.0 lb

## 2022-05-20 DIAGNOSIS — Z0000 Encounter for general adult medical examination without abnormal findings: Secondary | ICD-10-CM

## 2022-05-20 DIAGNOSIS — E785 Hyperlipidemia, unspecified: Secondary | ICD-10-CM

## 2022-05-20 DIAGNOSIS — R062 Wheezing: Secondary | ICD-10-CM | POA: Diagnosis not present

## 2022-05-20 DIAGNOSIS — Z7189 Other specified counseling: Secondary | ICD-10-CM

## 2022-05-20 DIAGNOSIS — Z1211 Encounter for screening for malignant neoplasm of colon: Secondary | ICD-10-CM | POA: Diagnosis not present

## 2022-05-20 DIAGNOSIS — J4520 Mild intermittent asthma, uncomplicated: Secondary | ICD-10-CM

## 2022-05-20 LAB — LIPID PANEL
Cholesterol: 256 mg/dL — ABNORMAL HIGH (ref 0–200)
HDL: 53.7 mg/dL (ref >39.00)
LDL Cholesterol: 179 mg/dL — ABNORMAL HIGH (ref 0–99)
NonHDL: 202.09
Total CHOL/HDL Ratio: 5
Triglycerides: 117 mg/dL (ref 0.0–149.0)
VLDL: 23.4 mg/dL (ref 0.0–40.0)

## 2022-05-20 LAB — COMPREHENSIVE METABOLIC PANEL
ALT: 17 U/L (ref 0–35)
AST: 15 U/L (ref 0–37)
Albumin: 4.1 g/dL (ref 3.5–5.2)
Alkaline Phosphatase: 67 U/L (ref 39–117)
BUN: 13 mg/dL (ref 6–23)
CO2: 29 mEq/L (ref 19–32)
Calcium: 9.2 mg/dL (ref 8.4–10.5)
Chloride: 105 mEq/L (ref 96–112)
Creatinine, Ser: 0.7 mg/dL (ref 0.40–1.20)
GFR: 99.36 mL/min (ref >60.00)
Glucose, Bld: 91 mg/dL (ref 70–99)
Potassium: 4.2 mEq/L (ref 3.5–5.1)
Sodium: 141 mEq/L (ref 135–145)
Total Bilirubin: 0.4 mg/dL (ref 0.2–1.2)
Total Protein: 6.9 g/dL (ref 6.0–8.3)

## 2022-05-20 MED ORDER — BUDESONIDE-FORMOTEROL FUMARATE 80-4.5 MCG/ACT IN AERO: 2.0000 | INHALATION_SPRAY | Freq: Two times a day (BID) | RESPIRATORY_TRACT | 3 refills | Status: DC

## 2022-05-20 MED ORDER — ALBUTEROL SULFATE HFA 108 (90 BASE) MCG/ACT IN AERS: 1.0000 | INHALATION_SPRAY | Freq: Four times a day (QID) | RESPIRATORY_TRACT | 3 refills | Status: DC | PRN

## 2022-05-20 NOTE — Patient Instructions (Signed)
Go to the lab on the way out.   If you have mychart we'll likely use that to update you.    Use symbicort twice a day.  I would get a flu shot each fall.   Take care.  Glad to see you.

## 2022-05-20 NOTE — Progress Notes (Unsigned)
CPE- See plan.  Routine anticipatory guidance given to patient.  See health maintenance.  The possibility exists that previously documented standard health maintenance information may have been brought forward from a previous encounter into this note.  If needed, that same information has been updated to reflect the current situation based on today's encounter.    IUD per gyn clinic.  Pap per gyn clinic.  She is due for follow up.  Discussed. Mammogram 2023 Pap per gyn, d/w pt.  D/w patient [id number] for colon cancer screening, including IFOB vs. colonoscopy.  Risks and benefits of both were discussed and patient voiced understanding.  Pt elects for cologuard. Husband designated if patient were incapacitated.   Lung cancer screening d/w pt.  She can consider.   Vaccines d/w pt.   [facility] and HCV [facility].   Diet and exercise d/w pt.    Wheeze. More at night. Smoking 1 PPD.  She had prev cut back.  D/w pt. using albuterol daily.  Discussed smoking cessation and change in inhalers.  Routine instructions given to patient.  She had lost and then regained some weight.  Stressors d/w pt.  She can try to cut back but spacing out her cigarettes.  Using albuterol more, daily.    See notes on x-ray and labs.  PMH and SH reviewed  Meds, vitals, and allergies reviewed.   ROS: Per HPI.  Unless specifically indicated otherwise in HPI, the patient denies:  General: fever. Eyes: acute vision changes ENT: sore throat Cardiovascular: chest pain Respiratory: SOB GI: vomiting GU: dysuria Musculoskeletal: acute back pain Derm: acute rash Neuro: acute motor dysfunction Psych: worsening mood Endocrine: polydipsia Heme: bleeding Allergy: hayfever  GEN: nad, alert and oriented HEENT: mucous membranes moist NECK: supple w/o LA CV: rrr. Murmur noted.   PULM: ctab except for scant exp wheeze, no inc wob ABD: soft, +bs EXT: no edema SKIN: well perfused.

## 2022-05-22 NOTE — Assessment & Plan Note (Signed)
IUD per gyn clinic.  Pap per gyn clinic.  She is due for follow up.  Discussed. Mammogram 2023 Pap per gyn, d/w pt.  D/w patient [id number] for colon cancer screening, including IFOB vs. colonoscopy.  Risks and benefits of both were discussed and patient voiced understanding.  Pt elects for cologuard. Husband designated if patient were incapacitated.   Lung cancer screening d/w pt.  She can consider.   Vaccines d/w pt.   [facility].   Diet and exercise d/w pt.

## 2022-05-22 NOTE — Assessment & Plan Note (Signed)
Husband designated if patient were incapacitated. 

## 2022-05-22 NOTE — Assessment & Plan Note (Signed)
Discussed smoking cessation.  Use albuterol as needed.  Start using Symbicort twice daily with routine cautions.  Rinse after use.  Hopefully she will need albuterol less.  Update me if not improving.  She agrees with plan.  Still okay for outpatient follow-up.  Flu shot encouraged.

## 2022-06-08 ENCOUNTER — Other Ambulatory Visit (HOSPITAL_COMMUNITY)
Admission: RE | Admit: 2022-06-08 | Discharge: 2022-06-08 | Disposition: A | Discharge status: Home / Self Care | Payer: BC Managed Care – PPO | Source: Ambulatory Visit | Attending: Adult Health | Admitting: Adult Health

## 2022-06-08 ENCOUNTER — Ambulatory Visit (INDEPENDENT_AMBULATORY_CARE_PROVIDER_SITE_OTHER): Payer: BC Managed Care – PPO | Admitting: Adult Health

## 2022-06-08 ENCOUNTER — Encounter: Payer: Self-pay | Admitting: Adult Health

## 2022-06-08 VITALS — BP 125/85 | HR 67 | Ht 61.5 in | Wt 180.0 lb

## 2022-06-08 DIAGNOSIS — Z1211 Encounter for screening for malignant neoplasm of colon: Secondary | ICD-10-CM | POA: Insufficient documentation

## 2022-06-08 DIAGNOSIS — Z124 Encounter for screening for malignant neoplasm of cervix: Secondary | ICD-10-CM

## 2022-06-08 DIAGNOSIS — F17200 Nicotine dependence, unspecified, uncomplicated: Secondary | ICD-10-CM

## 2022-06-08 DIAGNOSIS — Z01419 Encounter for gynecological examination (general) (routine) without abnormal findings: Secondary | ICD-10-CM | POA: Diagnosis not present

## 2022-06-08 DIAGNOSIS — Z975 Presence of (intrauterine) contraceptive device: Secondary | ICD-10-CM | POA: Insufficient documentation

## 2022-06-08 LAB — HEMOCCULT GUIAC POC 1CARD (OFFICE): Fecal Occult Blood, POC: NEGATIVE

## 2022-06-08 NOTE — Progress Notes (Signed)
Patient ID: Bonnie Murphy, female   DOB: 1970-11-30, 51 y.o.   MRN: [id number] History of Present Illness:  Bonnie Murphy is a 51 year old white female, married, G1P1 in for GYN Exam and pap. She had physical with PCP 05/20/22.  She has IUD, no bleeding.   PCP is Dr Damita Dunnings.  Current Medications, Allergies, Past Medical History, Past Surgical History, Family History and Social History were reviewed in [employer] record.     Review of Systems: Patient denies any headaches, hearing loss, fatigue, blurred vision, shortness of breath, chest pain, abdominal pain, problems with bowel movements, urination, or intercourse.(Not active).  No joint pain or mood swings.  Denies any bleeding or hot flashes, has IUD   Physical Exam:BP 125/85 (BP Location: Left Arm, Patient Position: Sitting, Cuff Size: Normal)   Pulse 67   Ht 5' 1.5" (1.562 m)   Wt 180 lb (81.6 kg)   BMI 33.46 kg/m   General:  Well developed, well nourished, no acute distress Skin:  Warm and dry Neck:  Midline trachea, normal thyroid, good ROM, no lymphadenopathy Lungs: +wheezing she is using Symbicort (had negative chest xray 05/20/22) Breast:  No dominant palpable mass, retraction, or nipple discharge Cardiovascular: Regular rate and rhythm Abdomen:  Soft, non tender, no hepatosplenomegaly Pelvic:  External genitalia is normal in appearance, no lesions.  The vagina is pale. Urethra has no lesions or masses. The cervix is smooth, +IUD strings at os, pap with HR HPV genotyping performed.  Uterus is felt to be normal size, shape, and contour.  No adnexal masses or tenderness noted.Bladder is non tender, no masses felt. Rectal: Good sphincter tone, no polyps, or hemorrhoids felt.  Hemoccult negative. Extremities/musculoskeletal:  No swelling or varicosities noted, no clubbing or cyanosis Psych:  No mood changes, alert and cooperative,seems happy AA is 1 Fall risk is low    06/08/2022    1:35 PM 05/20/2022    8:29 AM  01/08/2021    3:45 PM  Depression screen PHQ 2/9  Decreased Interest 0 0 0  Down, Depressed, Hopeless 0 0 0  PHQ - 2 Score 0 0 0  Altered sleeping 0  0  Tired, decreased energy 0  0  Change in appetite 0  0  Feeling bad or failure about yourself  0  0  Trouble concentrating 0  0  Moving slowly or fidgety/restless 0  0  Suicidal thoughts 0  0  PHQ-9 Score 0  0  Difficult doing work/chores   Not difficult at all       06/08/2022    1:35 PM  GAD 7 : Generalized Anxiety Score  Nervous, Anxious, on Edge 0  Control/stop worrying 0  Worry too much - different things 0  Trouble relaxing 0  Restless 0  Easily annoyed or irritable 0  Afraid - awful might happen 0  Total GAD 7 Score 0    Upstream - 06/08/22 1334       Pregnancy Intention Screening   Does the patient want to become pregnant in the next year? No    Does the patient's partner want to become pregnant in the next year? No    Would the patient like to discuss contraceptive options today? No      Contraception Wrap Up   Current Method IUD or IUS    End Method IUD or IUS            Examination chaperoned by Levy Pupa LPN   Impression  and Plan: 1. Encounter for gynecological examination with Papanicolaou smear of cervix Pap sent Pap in years if normal Physical with PCP Labs with PCP Had negative mammogram 11/25/21 Sent cologuard off 06/04/22  2. Encounter for screening fecal occult blood testing Hemoccult was negative   3. IUD (intrauterine device) in place Liletta placed 03/06/2019. Will leave in place for now  4. Smoker Try to decrease smoking

## 2022-06-10 LAB — COLOGUARD: COLOGUARD: NEGATIVE

## 2022-06-13 LAB — CYTOLOGY - PAP
Comment: NEGATIVE
Diagnosis: NEGATIVE
High risk HPV: NEGATIVE

## 2022-07-25 ENCOUNTER — Other Ambulatory Visit: Payer: Self-pay | Admitting: Family Medicine

## 2022-07-25 MED ORDER — BUDESONIDE-FORMOTEROL FUMARATE 80-4.5 MCG/ACT IN AERO: 2.0000 | INHALATION_SPRAY | Freq: Two times a day (BID) | RESPIRATORY_TRACT | 2 refills | Status: DC

## 2022-07-25 NOTE — Telephone Encounter (Signed)
Patient called back and stated it need to be sent to  [facility] [address], [place] - [address] [place] [address] Phone: [phone]  Fax: [phone]

## 2022-07-25 NOTE — Addendum Note (Signed)
Addended by: Wendie Simmer B on: 07/25/2022 02:55 PM   Modules accepted: Orders

## 2022-07-25 NOTE — Addendum Note (Signed)
Addended by: Wendie Simmer B on: 07/25/2022 12:26 PM   Modules accepted: Orders

## 2022-07-25 NOTE — Telephone Encounter (Signed)
LMTCB to see which pharmacy rx needs to be sent to

## 2022-07-25 NOTE — Telephone Encounter (Signed)
Erx sent

## 2022-07-25 NOTE — Telephone Encounter (Signed)
Pt's insurance needs budesonide-formoterol (SYMBICORT) 80-4.5 MCG/ACT inhaler sent in as a 90 day prescription or else it'll be a $300 charge to pt. Call back # [phone]

## 2022-08-09 ENCOUNTER — Other Ambulatory Visit: Payer: Self-pay | Admitting: Family Medicine

## 2022-09-05 ENCOUNTER — Other Ambulatory Visit: Payer: Self-pay | Admitting: Family Medicine

## 2023-02-13 ENCOUNTER — Other Ambulatory Visit: Payer: Self-pay | Admitting: Family Medicine

## 2023-02-14 NOTE — Telephone Encounter (Signed)
Patient will be due to CPE in sept. Please call to get patient scheduled.

## 2023-02-14 NOTE — Telephone Encounter (Signed)
Called and unable to lvm due to mailbox being full, sent mychart message

## 2023-04-10 ENCOUNTER — Other Ambulatory Visit (HOSPITAL_COMMUNITY): Payer: Self-pay | Admitting: Family Medicine

## 2023-04-10 DIAGNOSIS — Z1231 Encounter for screening mammogram for malignant neoplasm of breast: Secondary | ICD-10-CM

## 2023-04-19 ENCOUNTER — Encounter (HOSPITAL_COMMUNITY): Payer: Self-pay

## 2023-04-19 ENCOUNTER — Ambulatory Visit (HOSPITAL_COMMUNITY)
Admission: RE | Admit: 2023-04-19 | Discharge: 2023-04-19 | Disposition: A | Discharge status: Home / Self Care | Payer: BC Managed Care – PPO | Source: Ambulatory Visit | Attending: Family Medicine | Admitting: Family Medicine

## 2023-04-19 DIAGNOSIS — Z1231 Encounter for screening mammogram for malignant neoplasm of breast: Secondary | ICD-10-CM | POA: Diagnosis present

## 2023-04-24 ENCOUNTER — Other Ambulatory Visit (HOSPITAL_COMMUNITY): Payer: Self-pay | Admitting: Family Medicine

## 2023-04-24 DIAGNOSIS — R928 Other abnormal and inconclusive findings on diagnostic imaging of breast: Secondary | ICD-10-CM

## 2023-05-02 ENCOUNTER — Ambulatory Visit (HOSPITAL_COMMUNITY)
Admission: RE | Admit: 2023-05-02 | Discharge: 2023-05-02 | Disposition: A | Discharge status: Home / Self Care | Payer: BC Managed Care – PPO | Source: Ambulatory Visit | Attending: Family Medicine | Admitting: Family Medicine

## 2023-05-02 DIAGNOSIS — R928 Other abnormal and inconclusive findings on diagnostic imaging of breast: Secondary | ICD-10-CM | POA: Insufficient documentation

## 2023-05-04 ENCOUNTER — Encounter: Payer: BC Managed Care – PPO | Admitting: Family Medicine

## 2023-05-16 ENCOUNTER — Other Ambulatory Visit: Payer: Self-pay | Admitting: Family Medicine

## 2023-05-19 ENCOUNTER — Encounter: Payer: BC Managed Care – PPO | Admitting: Family Medicine

## 2023-05-23 ENCOUNTER — Other Ambulatory Visit (HOSPITAL_COMMUNITY): Payer: BC Managed Care – PPO

## 2023-05-23 ENCOUNTER — Encounter (HOSPITAL_COMMUNITY): Payer: BC Managed Care – PPO

## 2023-07-16 NOTE — Progress Notes (Deleted)
Cardiology Office Note   Date:  07/16/2023   ID:  Bonnie Murphy, DOB 01-31-1971, MRN [id number]  PCP:  Joaquim Nam, MD  Cardiologist:   Dietrich Pates, MD   Patient presents for eval of murmur     History of Present Illness: Bonnie Murphy is a 52 y.o. female  who is followed by Linton Rump she had a murmur about 1 year ago     She is very active  Works at [employer] is OK  Denies CP    No dizziness  No edema  No palpitations    I saw the pt in 2022  No outpatient medications have been marked as taking for the 07/19/23 encounter (Appointment) with Pricilla Riffle, MD.     Allergies:   Patient has no known allergies.   Past Medical History:  Diagnosis Date   Asthma    seasonal   Cyst of right breast    IUD (intrauterine device) in place 11/21/2014   OAB (overactive bladder) 11/21/2014   Wheezing 11/15/2013    Past Surgical History:  Procedure Laterality Date   CESAREAN SECTION       Social History:  The patient  reports that she has been smoking cigarettes. She has a 31 pack-year smoking history. She has never used smokeless tobacco. She reports current alcohol use. She reports that she does not use drugs.   Family History:  The patient's family history includes Cirrhosis in her father and paternal grandmother; Diabetes in her mother; Lung cancer in her father, mother, paternal aunt, and paternal uncle.    ROS:  Please see the history of present illness. All other systems are reviewed and  Negative to the above problem except as noted.    PHYSICAL EXAM: VS:  There were no vitals taken for this visit.  GEN: Well nourished, well developed, in no acute distress  HEENT: normal  Neck: no JVD, carotid bruits, or masses Cardiac: RRR; no murmurs, rubs, or gallops,no edema  Respiratory:  clear to auscultation bilaterally, normal work of breathing GI: soft, nontender, nondistended, + BS  No hepatomegaly  MS: no deformity Moving all extremities   Skin: warm and  dry, no rash Neuro:  Strength and sensation are intact Psych: euthymic mood, full affect   EKG:  EKG is ordered today.  SR 72 bpm     Echo   02/02/21  1. Left ventricular ejection fraction, by estimation, is 60 to 65%. The left ventricle has normal function. The left ventricle has no regional wall motion abnormalities. There is mild left ventricular hypertrophy. Left ventricular diastolic parameters were normal. 2. Right ventricular systolic function is normal. The right ventricular size is normal. 3. The mitral valve is normal in structure. No evidence of mitral valve regurgitation. No evidence of mitral stenosis. 4. The aortic valve has an indeterminant number of cusps. There is mild calcification of the aortic valve. There is mild thickening of the aortic valve. Aortic valve regurgitation is mild. Mild to moderate aortic valve stenosis. Aortic valve mean gradient measures 16.0 mmHg. Aortic valve peak gradient measures 31.8 mmHg. Aortic valve area, by VTI measures 1.17 cm. 5. The inferior vena cava is normal in size with greater than 50% respiratory variability, suggesting right atrial pressure of 3 mmHg.  Lipid Panel    Component Value Date/Time   CHOL 256 (H) 05/20/2022 0928   CHOL 180 11/21/2014 0910   TRIG 117.0 05/20/2022 0928   HDL 53.70 05/20/2022  0928   HDL 43 11/21/2014 0910   CHOLHDL 5 05/20/2022 0928   VLDL 23.4 05/20/2022 0928   LDLCALC 179 (H) 05/20/2022 0928   LDLCALC 182 (H) 01/08/2021 1628      Wt Readings from Last 3 Encounters:  06/08/22 180 lb (81.6 kg)  05/20/22 180 lb (81.6 kg)  07/22/21 163 lb (73.9 kg)      ASSESSMENT AND PLAN:  1  Aortic stenosis    Reviewed echo images with Pt   Difficult to see but ? If functionally bicuspid.    Appears to be mildly stenotic.   Symptom free I would recomm follow up echo in 1 year    Stay active     Current medicines are reviewed at length with the patient today.  The patient does not have concerns  regarding medicines.  Signed, Dietrich Pates, MD  07/16/2023 10:24 PM    [facility] [address] [place], [place], [place]  [address] Phone: [phone]; Fax: [phone]

## 2023-07-19 ENCOUNTER — Ambulatory Visit: Payer: BC Managed Care – PPO | Admitting: Internal Medicine

## 2023-07-20 ENCOUNTER — Encounter: Payer: Self-pay | Admitting: Family Medicine

## 2023-07-20 ENCOUNTER — Ambulatory Visit (INDEPENDENT_AMBULATORY_CARE_PROVIDER_SITE_OTHER): Payer: BC Managed Care – PPO | Admitting: Family Medicine

## 2023-07-20 VITALS — BP 118/70 | HR 63 | Temp 98.7°F | Ht 61.5 in | Wt 182.6 lb

## 2023-07-20 DIAGNOSIS — Z122 Encounter for screening for malignant neoplasm of respiratory organs: Secondary | ICD-10-CM | POA: Diagnosis not present

## 2023-07-20 DIAGNOSIS — E785 Hyperlipidemia, unspecified: Secondary | ICD-10-CM

## 2023-07-20 DIAGNOSIS — Z0000 Encounter for general adult medical examination without abnormal findings: Secondary | ICD-10-CM

## 2023-07-20 DIAGNOSIS — J4520 Mild intermittent asthma, uncomplicated: Secondary | ICD-10-CM

## 2023-07-20 DIAGNOSIS — Z7189 Other specified counseling: Secondary | ICD-10-CM

## 2023-07-20 LAB — COMPREHENSIVE METABOLIC PANEL
ALT: 15 U/L (ref 0–35)
AST: 15 U/L (ref 0–37)
Albumin: 4.2 g/dL (ref 3.5–5.2)
Alkaline Phosphatase: 74 U/L (ref 39–117)
BUN: 11 mg/dL (ref 6–23)
CO2: 27 meq/L (ref 19–32)
Calcium: 8.9 mg/dL (ref 8.4–10.5)
Chloride: 106 meq/L (ref 96–112)
Creatinine, Ser: 0.68 mg/dL (ref 0.40–1.20)
GFR: 99.24 mL/min (ref >60.00)
Glucose, Bld: 105 mg/dL — ABNORMAL HIGH (ref 70–99)
Potassium: 3.7 meq/L (ref 3.5–5.1)
Sodium: 139 meq/L (ref 135–145)
Total Bilirubin: 0.4 mg/dL (ref 0.2–1.2)
Total Protein: 7 g/dL (ref 6.0–8.3)

## 2023-07-20 LAB — LIPID PANEL
Cholesterol: 253 mg/dL — ABNORMAL HIGH (ref 0–200)
HDL: 43.9 mg/dL (ref >39.00)
LDL Cholesterol: 170 mg/dL — ABNORMAL HIGH (ref 0–99)
NonHDL: 209.4
Total CHOL/HDL Ratio: 6
Triglycerides: 199 mg/dL — ABNORMAL HIGH (ref 0.0–149.0)
VLDL: 39.8 mg/dL (ref 0.0–40.0)

## 2023-07-20 MED ORDER — BUDESONIDE-FORMOTEROL FUMARATE 80-4.5 MCG/ACT IN AERO: 2.0000 | INHALATION_SPRAY | Freq: Two times a day (BID) | RESPIRATORY_TRACT | 3 refills | Status: DC

## 2023-07-20 MED ORDER — ALBUTEROL SULFATE HFA 108 (90 BASE) MCG/ACT IN AERS: 1.0000 | INHALATION_SPRAY | Freq: Four times a day (QID) | RESPIRATORY_TRACT | 3 refills | Status: DC | PRN

## 2023-07-20 MED ORDER — MONTELUKAST SODIUM 10 MG PO TABS: 10.0000 mg | ORAL_TABLET | Freq: Every day | ORAL | 3 refills | Status: DC

## 2023-07-20 NOTE — Patient Instructions (Addendum)
You should get a call about seeing pulmonary.  Take care.  Glad to see you. Go to the lab on the way out.   If you have mychart we'll likely use that to update you.

## 2023-07-20 NOTE — Progress Notes (Signed)
CPE- See plan.  Routine anticipatory guidance given to patient.  See health maintenance.  The possibility exists that previously documented standard health maintenance information may have been brought forward from a previous encounter into this note.  If needed, that same information has been updated to reflect the current situation based on today's encounter.    IUD per gyn clinic.  Mammogram 2024 Pap per gyn 2023 Cologuard 2023.  Husband designated if patient were incapacitated.   Lung cancer screening d/w pt.  Referral placed.    Smoking cessation d/w pt.  She didn't tolerate wellbutrin but is going to try nicotine gum.   Vaccines d/w pt. routine vaccination encouraged. [facility] and HCV [facility].   Diet and exercise d/w pt.    Still using Symbicort BID, rinsing after use.  Rare SABA use.  D/w pt about smoking cessation. Still on singulair.    HLD.  Recheck labs pending.  Diet and exercise d/w pt.    PMH and SH reviewed Meds, vitals, and allergies reviewed.   ROS: Per HPI.  Unless specifically indicated otherwise in HPI, the patient denies:  General: fever. Eyes: acute vision changes ENT: sore throat Cardiovascular: chest pain Respiratory: SOB GI: vomiting GU: dysuria Musculoskeletal: acute back pain Derm: acute rash Neuro: acute motor dysfunction Psych: worsening mood Endocrine: polydipsia Heme: bleeding Allergy: hayfever  GEN: nad, alert and oriented HEENT: ncat NECK: supple w/o LA CV: rrr. PULM: ctab, no inc wob ABD: soft, +bs EXT: no edema SKIN: well perfused.

## 2023-07-23 NOTE — Assessment & Plan Note (Signed)
Still using Symbicort BID, rinsing after use.  Rare SABA use.  D/w pt about smoking cessation. Still on singulair.  Continue as is.

## 2023-07-23 NOTE — Assessment & Plan Note (Signed)
IUD per gyn clinic.  Mammogram 2024 Pap per gyn 2023 Cologuard 2023.  Husband designated if patient were incapacitated.   Lung cancer screening d/w pt.  Referral placed.    Smoking cessation d/w pt.  She didn't tolerate wellbutrin but is going to try nicotine gum.   Vaccines d/w pt. routine vaccination encouraged. [facility] and HCV [facility].   Diet and exercise d/w pt.

## 2023-07-23 NOTE — Assessment & Plan Note (Signed)
Recheck labs pending.  Diet and exercise d/w pt.

## 2023-07-23 NOTE — Assessment & Plan Note (Signed)
Husband designated if patient were incapacitated. 

## 2023-10-29 NOTE — Progress Notes (Deleted)
 Cardiology Office Note   Date:  10/29/2023   ID:  Bonnie Murphy, Bonnie Murphy 1971/06/05, MRN [id number]  PCP:  Joaquim Nam, MD  Cardiologist:   Dietrich Pates, MD   Patient presents for eval of murmur     History of Present Illness: Bonnie Murphy is a 53 y.o. female  who is followed by Linton Rump she had a murmur about 1 year ago     She is very active  Works at [employer] is OK  Denies CP    No dizziness  No edema  No palpitations    I saw the pt in May 2022  No outpatient medications have been marked as taking for the 10/31/23 encounter (Appointment) with Pricilla Riffle, MD.     Allergies:   Patient has no known allergies.   Past Medical History:  Diagnosis Date   Asthma    seasonal   Cyst of right breast    IUD (intrauterine device) in place 11/21/2014   OAB (overactive bladder) 11/21/2014   Wheezing 11/15/2013    Past Surgical History:  Procedure Laterality Date   CESAREAN SECTION       Social History:  The patient  reports that she has been smoking cigarettes. She has a 31 pack-year smoking history. She has never used smokeless tobacco. She reports current alcohol use. She reports that she does not use drugs.   Family History:  The patient's family history includes Cirrhosis in her father and paternal grandmother; Diabetes in her mother; Lung cancer in her father, mother, paternal aunt, and paternal uncle.    ROS:  Please see the history of present illness. All other systems are reviewed and  Negative to the above problem except as noted.    PHYSICAL EXAM: VS:  There were no vitals taken for this visit.  GEN: Well nourished, well developed, in no acute distress  HEENT: normal  Neck: no JVD, carotid bruits, or masses Cardiac: RRR; no murmurs, rubs, or gallops,no edema  Respiratory:  clear to auscultation bilaterally, normal work of breathing GI: soft, nontender, nondistended, + BS  No hepatomegaly  MS: no deformity Moving all extremities   Skin: warm and  dry, no rash Neuro:  Strength and sensation are intact Psych: euthymic mood, full affect   EKG:  EKG is ordered today.  SR 72 bpm     Echo   02/02/21  1. Left ventricular ejection fraction, by estimation, is 60 to 65%. The left ventricle has normal function. The left ventricle has no regional wall motion abnormalities. There is mild left ventricular hypertrophy. Left ventricular diastolic parameters were normal. 2. Right ventricular systolic function is normal. The right ventricular size is normal. 3. The mitral valve is normal in structure. No evidence of mitral valve regurgitation. No evidence of mitral stenosis. 4. The aortic valve has an indeterminant number of cusps. There is mild calcification of the aortic valve. There is mild thickening of the aortic valve. Aortic valve regurgitation is mild. Mild to moderate aortic valve stenosis. Aortic valve mean gradient measures 16.0 mmHg. Aortic valve peak gradient measures 31.8 mmHg. Aortic valve area, by VTI measures 1.17 cm. 5. The inferior vena cava is normal in size with greater than 50% respiratory variability, suggesting right atrial pressure of 3 mmHg.  Lipid Panel    Component Value Date/Time   CHOL 253 (H) 07/20/2023 1126   CHOL 180 11/21/2014 0910   TRIG 199.0 (H) 07/20/2023 1126   HDL  43.90 07/20/2023 1126   HDL 43 11/21/2014 0910   CHOLHDL 6 07/20/2023 1126   VLDL 39.8 07/20/2023 1126   LDLCALC 170 (H) 07/20/2023 1126   LDLCALC 182 (H) 01/08/2021 1628      Wt Readings from Last 3 Encounters:  07/20/23 182 lb 9.6 oz (82.8 kg)  06/08/22 180 lb (81.6 kg)  05/20/22 180 lb (81.6 kg)      ASSESSMENT AND PLAN:  1  Aortic stenosis    Reviewed echo images with Pt   Difficult to see but ? If functionally bicuspid.    Appears to be mildly stenotic.   Symptom free I would recomm follow up echo in 1 year    Stay active     Current medicines are reviewed at length with the patient today.  The patient does not have  concerns regarding medicines.  Signed, Dietrich Pates, MD  10/29/2023 6:40 PM    [facility] [address] [place], [place], [place]  [address] Phone: [phone]; Fax: [phone]

## 2023-10-31 ENCOUNTER — Ambulatory Visit: Payer: BC Managed Care – PPO | Admitting: Internal Medicine

## 2023-11-07 ENCOUNTER — Ambulatory Visit: Payer: Self-pay | Admitting: Family Medicine

## 2023-11-07 ENCOUNTER — Encounter: Payer: Self-pay | Admitting: Family

## 2023-11-07 ENCOUNTER — Telehealth (INDEPENDENT_AMBULATORY_CARE_PROVIDER_SITE_OTHER): Admitting: Family

## 2023-11-07 DIAGNOSIS — J4520 Mild intermittent asthma, uncomplicated: Secondary | ICD-10-CM | POA: Diagnosis not present

## 2023-11-07 DIAGNOSIS — J0110 Acute frontal sinusitis, unspecified: Secondary | ICD-10-CM

## 2023-11-07 MED ORDER — AMOXICILLIN-POT CLAVULANATE 875-125 MG PO TABS: 1.0000 | ORAL_TABLET | Freq: Two times a day (BID) | ORAL | 0 refills | Status: DC

## 2023-11-07 NOTE — Telephone Encounter (Signed)
  Chief Complaint: Sinus pain Symptoms: Sinus pain, cough, ear congestion Frequency: Ongoing since last Sunday Pertinent Negatives: Patient denies SOB Disposition: [] ED /[] Urgent Care (no appt availability in office) / [x] Appointment(In office/virtual)/ []  [facility]/ [] Home Care/ [] Refused Recommended Disposition /[] [facility]/ []  Follow-up with PCP Additional Notes: Patient stated that she has been having sinus pain, cough, and ear congestion. Symptoms started last Sunday. She believes that she was exposed to the flu. Patient prefers virtual visit. Appointment scheduled for today.   Answer Assessment - Initial Assessment Questions 1. LOCATION: "Where does it hurt?"      Cheeks right below the eyes  2. ONSET: "When did the sinus pain start?"  (e.g., hours, days)      Last Sunday   3. SEVERITY: "How bad is the pain?"   (Scale 1-10; mild, moderate or severe)   - MILD (1-3): doesn't interfere with normal activities    - MODERATE (4-7): interferes with normal activities (e.g., work or school) or awakens from sleep   - SEVERE (8-10): excruciating pain and patient unable to do any normal activities        6/10  5. NASAL CONGESTION: "Is the nose blocked?" If Yes, ask: "Can you open it or must you breathe through your mouth?"     Advil Cold and Sinus seems to be helping with any nasal congestion.  6. NASAL DISCHARGE: "Do you have discharge from your nose?" If so ask, "What color?"     Green   7. FEVER: "Do you have a fever?" If Yes, ask: "What is it, how was it measured, and when did it start?"      No  8. OTHER SYMPTOMS: "Do you have any other symptoms?" (e.g., sore throat, cough, earache, difficulty breathing)     Ears feel full (like there is water) , productive cough  Protocols used: Sinus Pain or Congestion-A-AH

## 2023-11-07 NOTE — Telephone Encounter (Signed)
 1st attempt, voicemail box is full, unable to leave a voicemail.  Copied from CRM [id number]. Topic: Clinical - Pink Word Triage >> Nov 07, 2023  7:39 AM Bonnie Murphy wrote: Reason for CRM: Patient is calling wanting the doctor to call her something for sinus after having the flu, patient states she is having green mucus discharge with face swelling from sinus pressure. Please call patient at [phone]

## 2023-11-07 NOTE — Telephone Encounter (Signed)
 Saw patient already, thank you for speaking with the patient.  Please see note for further information.

## 2023-11-07 NOTE — Assessment & Plan Note (Signed)
 Prescription given for augmentin 875/125 mg po bid for ten days. Pt to continue tylenol/ibuprofen prn sinus pain. Continue with humidifier prn and steam showers recommended as well. instructed If no symptom improvement in 48 hours please f/u

## 2023-11-07 NOTE — Telephone Encounter (Signed)
 Noted. Thanks.

## 2023-11-07 NOTE — Progress Notes (Signed)
 Virtual Visit via Video note  I connected with Bonnie Murphy on 11/07/23 at home by video and verified that I am speaking with the correct person using two identifiers.The provider, Mort Sawyers, FNP is located in their home at time of visit.  I discussed the limitations, risks, security and privacy concerns of performing an evaluation and management service by video and the availability of in person appointments. I also discussed with the patient that there may be a patient responsible charge related to this service. The patient expressed understanding and agreed to proceed.  Subjective: PCP: Joaquim Nam, MD  Chief Complaint  Patient presents with   Acute Visit    Reports congestion, cough with production of green mucus. Denies fever, body aches or chills.     HPI  C/o symptoms that started about one week ago.  Her grandson last week was dx with the flu. She states she is on the back end of symptoms except now she is having runny nose with green sputum, headache, and sinus pressure tender to the touch. No sore throat.   No fever or chills at current. Cough is productive as well was dry prior.  Ears feel full at times.   Otc meds include advil and sinus, helps slightly.      ROS: Per HPI  Current Outpatient Medications:    albuterol (VENTOLIN HFA) 108 (90 Base) MCG/ACT inhaler, Inhale 1-2 puffs into the lungs every 6 (six) hours as needed for wheezing or shortness of breath., Disp: 8 g, Rfl: 3   amoxicillin-clavulanate (AUGMENTIN) 875-125 MG tablet, Take 1 tablet by mouth 2 (two) times daily., Disp: 20 tablet, Rfl: 0   budesonide-formoterol (SYMBICORT) 80-4.5 MCG/ACT inhaler, Inhale 2 puffs into the lungs 2 (two) times daily. Rinse after using., Disp: 3 each, Rfl: 3   ibuprofen (ADVIL) 200 MG tablet, Take 800 mg by mouth 2 (two) times daily as needed., Disp: , Rfl:    levonorgestrel (LILETTA, 52 MG,) 19.5 MCG/DAY IUD IUD, 1 each by Intrauterine route once., Disp: , Rfl:     montelukast (SINGULAIR) 10 MG tablet, Take 1 tablet (10 mg total) by mouth at bedtime., Disp: 90 tablet, Rfl: 3  Observations/Objective: Physical Exam Constitutional:      General: She is not in acute distress.    Appearance: Normal appearance. She is not ill-appearing.  Pulmonary:     Effort: Pulmonary effort is normal.  Neurological:     General: No focal deficit present.     Mental Status: She is alert and oriented to person, place, and time.  Psychiatric:        Mood and Affect: Mood normal.        Behavior: Behavior normal.        Thought Content: Thought content normal.     Assessment and Plan: Mild intermittent extrinsic asthma without complication  Acute non-recurrent frontal sinusitis Assessment & Plan: Prescription given for augmentin 875/125 mg po bid for ten days. Pt to continue tylenol/ibuprofen prn sinus pain. Continue with humidifier prn and steam showers recommended as well. instructed If no symptom improvement in 48 hours please f/u   Orders: -     Amoxicillin-Pot Clavulanate; Take 1 tablet by mouth 2 (two) times daily.  Dispense: 20 tablet; Refill: 0    Follow Up Instructions: Return if symptoms worsen or fail to improve.   I discussed the assessment and treatment plan with the patient. The patient was provided an opportunity to ask questions and all were answered.  The patient agreed with the plan and demonstrated an understanding of the instructions.   The patient was advised to call back or seek an in-person evaluation if the symptoms worsen or if the condition fails to improve as anticipated.  The above assessment and management plan was discussed with the patient. The patient verbalized understanding of and has agreed to the management plan. Patient is aware to call the clinic if symptoms persist or worsen. Patient is aware when to return to the clinic for a follow-up visit. Patient educated on when it is appropriate to go to the emergency department.      Mort Sawyers, MSN, APRN, FNP-C [facility]

## 2023-11-09 ENCOUNTER — Other Ambulatory Visit: Payer: Self-pay | Admitting: Family Medicine

## 2023-11-09 NOTE — Telephone Encounter (Signed)
 Copied from CRM [id number]. Topic: Clinical - Medication Refill >> Nov 09, 2023  8:45 AM Pascal Lux wrote: Most Recent Primary Care Visit:  Provider: Mort Sawyers  Department: [facility]  Visit Type: ACUTE  Date: 11/07/2023  Medication: prednisone  Has the patient contacted their pharmacy? No (Agent: If no, request that the patient contact the pharmacy for the refill. If patient does not wish to contact the pharmacy document the reason why and proceed with request.) (Agent: If yes, when and what did the pharmacy advise?)  Is this the correct pharmacy for this prescription? Yes If no, delete pharmacy and type the correct one.  This is the patient's preferred pharmacy:  [facility] [address], [place] - [address] [place] [address] [place] [address] Phone: [phone] Fax: [phone]   Has the prescription been filled recently? No  Is the patient out of the medication? No  Has the patient been seen for an appointment in the last year OR does the patient have an upcoming appointment? Yes  Can we respond through MyChart? Yes  Agent: Please be advised that Rx refills may take up to 3 business days. We ask that you follow-up with your pharmacy.

## 2023-12-26 ENCOUNTER — Encounter: Payer: Self-pay | Admitting: Student

## 2023-12-26 ENCOUNTER — Ambulatory Visit: Payer: BC Managed Care – PPO | Attending: Student | Admitting: Student

## 2023-12-26 VITALS — BP 118/78 | HR 71 | Ht 61.75 in | Wt 191.2 lb

## 2023-12-26 DIAGNOSIS — I351 Nonrheumatic aortic (valve) insufficiency: Secondary | ICD-10-CM | POA: Diagnosis not present

## 2023-12-26 DIAGNOSIS — I350 Nonrheumatic aortic (valve) stenosis: Secondary | ICD-10-CM | POA: Diagnosis not present

## 2023-12-26 DIAGNOSIS — R0609 Other forms of dyspnea: Secondary | ICD-10-CM

## 2023-12-26 NOTE — Progress Notes (Signed)
 Cardiology Office Note    Date:  12/26/2023  ID:  Nikiya, Starn 1970-11-25, MRN [id number] Cardiologist: Ola Berger, MD    History of Present Illness:    Bonnie Murphy is a 53 y.o. female with past medical history of aortic stenosis, tobacco use and asthma who presents to the office today for overdue follow-up.  She was last examined by Dr. Avanell Bob in 04/2021 as a new patient referral for a murmur. Was very active at baseline and denied any recent anginal symptoms. Recent echocardiogram has shown a preserved EF of 60 to 65% with no regional wall motion abnormalities. RV function was normal.  She did have mild calcification of the aortic valve along with mild aortic valve regurgitation and mild to moderate aortic valve stenosis. It was recommend to have a follow-up echocardiogram in 1 year and this was performed in 02/2022 and showed no significant change from prior imaging with moderate AS and mild AI.  In talking with the patient today, she reports overall feeling well since her last office visit. She is active at her job and also reports staying active by keeping up with her 2 grandsons. She has noticed worsening shortness of breath when climbing up a flight of stairs over the past few months but no symptoms when walking on flat surfaces. She is unsure if this is due to her asthma or allergies. No exertional chest pain or palpitations. No specific orthopnea, PND or pitting edema. No dizziness or presyncope.  Studies Reviewed:   EKG: EKG is ordered today and demonstrates:   EKG Interpretation Date/Time:  Tuesday December 26 2023 14:05:27 EDT Ventricular Rate:  70 PR Interval:  152 QRS Duration:  90 QT Interval:  406 QTC Calculation: 438 R Axis:   31  Text Interpretation: Normal sinus rhythm Normal ECG Confirmed by Woodfin Hays ([id number]) on 12/26/2023 2:09:26 PM       Echocardiogram: 02/2022 IMPRESSIONS     1. Left ventricular ejection fraction, by estimation, is 60 to 65%. The   left ventricle has normal function. The left ventricle has no regional  wall motion abnormalities. There is mild left ventricular hypertrophy.  Left ventricular diastolic parameters  were normal.   2. Right ventricular systolic function is normal. The right ventricular  size is normal. Tricuspid regurgitation signal is inadequate for assessing  PA pressure.   3. The mitral valve is grossly normal. Trivial mitral valve  regurgitation.   4. The aortic valve is bicuspid. There is moderate calcification of the  aortic valve. Aortic valve regurgitation is mild. Moderate aortic valve  stenosis. Aortic valve mean gradient measures 18.0 mmHg. Dimentionless  index 0.36.   5. The inferior vena cava is normal in size with greater than 50%  respiratory variability, suggesting right atrial pressure of 3 mmHg.   Comparison(s): Prior images reviewed side by side. Bicuspid aortic valve  with moderate aortic stenosis, mean gradient 18 mmHg from 16 mmHg and  dimentionless index 0.36.    Physical Exam:   VS:  BP 132/86 (BP Location: Right Arm, Cuff Size: Normal)   Pulse 71   Ht 5' 1.75" (1.568 m)   Wt 191 lb 3.2 oz (86.7 kg)   SpO2 98%   BMI 35.25 kg/m    Wt Readings from Last 3 Encounters:  12/26/23 191 lb 3.2 oz (86.7 kg)  07/20/23 182 lb 9.6 oz (82.8 kg)  06/08/22 180 lb (81.6 kg)     GEN: Pleasant female appearing in no acute distress  NECK: No JVD; No carotid bruits CARDIAC: RRR, 3/6 systolic murmur along RUSB.  RESPIRATORY: No rhonchi or rales. Scattered expiratory wheezing along upper lung fields.  ABDOMEN: Appears non-distended. No obvious abdominal masses. EXTREMITIES: No clubbing or cyanosis. No pitting edema.  Distal pedal pulses are 2+ bilaterally.   Assessment and Plan:   1. Aortic Stenosis/Aortic Regurgitation - Most recent echocardiogram in 02/2022 showed her aortic valve was bicuspid and she did have mild aortic valve regurgitation and moderate aortic valve stenosis. Will  plan for a follow-up echocardiogram for reassessment.  2. Dyspnea on Exertion - Reports worsening symptoms over the past few months. She does have known asthma and continues to smoke.  Cessation advised but reports she has been unable to stop in the past due to worsening stress. - EKG today is without acute ST changes. Will plan for a follow-up echocardiogram as discussed above.  Disposition: Repeat echo as discussed above. If no significant change, would plan for follow-up in 1 year unless she develops new symptoms in the interim.   Signed, Dorma Gash, PA-C

## 2023-12-26 NOTE — Patient Instructions (Signed)
 Medication Instructions:   Continue current regimen.   *If you need a refill on your cardiac medications before your next appointment, please call your pharmacy*  Testing/Procedures:  Echocardiogram - Ultrasound of your heart.    Follow-Up: At [facility], you and your health needs are our priority.  As part of our continuing mission to provide you with exceptional heart care, our providers are all part of one team.  This team includes your primary Cardiologist (physician) and Advanced Practice Providers or APPs (Physician Assistants and Nurse Practitioners) who all work together to provide you with the care you need, when you need it.  Your next appointment:   1 year(s)  Provider:   You may see Ola Berger, MD or one of the following Advanced Practice Providers on your designated Care Team:   Woodfin Hays, PA-C  [place], [place] Theotis Flake, [place]     We recommend signing up for the patient portal called "MyChart".  Sign up information is provided on this After Visit Summary.  MyChart is used to connect with patients for Virtual Visits (Telemedicine).  Patients are able to view lab/test results, encounter notes, upcoming appointments, etc.  Non-urgent messages can be sent to your provider as well.   To learn more about what you can do with MyChart, go to [email or web address].

## 2024-01-19 ENCOUNTER — Ambulatory Visit (HOSPITAL_COMMUNITY)
Admission: RE | Admit: 2024-01-19 | Discharge: 2024-01-19 | Disposition: A | Discharge status: Home / Self Care | Source: Ambulatory Visit | Attending: Student | Admitting: Student

## 2024-01-19 DIAGNOSIS — I350 Nonrheumatic aortic (valve) stenosis: Secondary | ICD-10-CM | POA: Diagnosis present

## 2024-01-19 LAB — ECHOCARDIOGRAM COMPLETE
AR max vel: 1 cm2
AV Area VTI: 1.14 cm2
AV Area mean vel: 1.16 cm2
AV Mean grad: 20 mmHg
AV Peak grad: 35.6 mmHg
Ao pk vel: 2.98 m/s
Area-P 1/2: 3.85 cm2
Calc EF: 60.8 %
S' Lateral: 2.5 cm
Single Plane A2C EF: 49.9 %
Single Plane A4C EF: 69.2 %

## 2024-01-20 ENCOUNTER — Ambulatory Visit: Payer: Self-pay | Admitting: Student

## 2024-04-26 ENCOUNTER — Encounter: Payer: Self-pay | Admitting: Family Medicine

## 2024-04-26 ENCOUNTER — Ambulatory Visit (INDEPENDENT_AMBULATORY_CARE_PROVIDER_SITE_OTHER): Admitting: Family Medicine

## 2024-04-26 VITALS — BP 130/84 | HR 84 | Temp 98.2°F | Ht 63.39 in | Wt 191.4 lb

## 2024-04-26 DIAGNOSIS — Z7189 Other specified counseling: Secondary | ICD-10-CM

## 2024-04-26 DIAGNOSIS — I350 Nonrheumatic aortic (valve) stenosis: Secondary | ICD-10-CM

## 2024-04-26 DIAGNOSIS — E785 Hyperlipidemia, unspecified: Secondary | ICD-10-CM

## 2024-04-26 DIAGNOSIS — J4520 Mild intermittent asthma, uncomplicated: Secondary | ICD-10-CM

## 2024-04-26 DIAGNOSIS — Z0000 Encounter for general adult medical examination without abnormal findings: Secondary | ICD-10-CM

## 2024-04-26 DIAGNOSIS — R0683 Snoring: Secondary | ICD-10-CM

## 2024-04-26 MED ORDER — ALBUTEROL SULFATE HFA 108 (90 BASE) MCG/ACT IN AERS: 1.0000 | INHALATION_SPRAY | Freq: Four times a day (QID) | RESPIRATORY_TRACT | 3 refills | Status: AC | PRN

## 2024-04-26 MED ORDER — MONTELUKAST SODIUM 10 MG PO TABS: 10.0000 mg | ORAL_TABLET | Freq: Every day | ORAL | 3 refills | Status: AC

## 2024-04-26 MED ORDER — BUDESONIDE-FORMOTEROL FUMARATE 80-4.5 MCG/ACT IN AERO: 2.0000 | INHALATION_SPRAY | Freq: Two times a day (BID) | RESPIRATORY_TRACT | 3 refills | Status: DC

## 2024-04-26 NOTE — Patient Instructions (Signed)
 Refer to pulmonary about the snoring.   Let me know if you can't get set up.  Take care.  Glad to see you. Go to the lab on the way out.   If you have mychart we'll likely use that to update you.

## 2024-04-26 NOTE — Progress Notes (Signed)
 CPE- See plan.  Routine anticipatory guidance given to patient.  See health maintenance.  The possibility exists that previously documented standard health maintenance information may have been brought forward from a previous encounter into this note.  If needed, that same information has been updated to reflect the current situation based on today's encounter.    IUD per gyn clinic.  Mammogram pending 2025 Pap per gyn 2023 Cologuard 2023.  Husband designated if patient were incapacitated.   Lung cancer screening d/w pt.  She didn't want to go through with that yet.  Smoking cessation d/w pt.  She didn't tolerate wellbutrin  but is going to try nicotine gum, d/w pt.   Vaccines d/w pt. Routine vaccination encouraged. [facility] and HCV [facility] ~[facility].   Diet and exercise d/w pt.     Still using Symbicort  BID, rinsing after use.  Usually no SABA use with symbicort .  D/w pt about smoking cessation. Still on singulair .  Some occ wheeze with high humidity.     HLD.  Recheck labs pending.  Diet and exercise d/w pt.    H/o AS with stable echo this year. No CP.  Not SOB.  Not lightheaded.    She has sig snoring, d/w pt.  Referral to pulmonary d/w pt.  [facility] path/phys d/w pt.    D/w pt about diet and exercise, weight.  Would get sleep study done first, as that may change the plan with weight loss tx.  D/w pt.     PMH and SH reviewed  Meds, vitals, and allergies reviewed.   ROS: Per HPI.  Unless specifically indicated otherwise in HPI, the patient denies:  General: fever. Eyes: acute vision changes ENT: sore throat Cardiovascular: chest pain Respiratory: SOB GI: vomiting GU: dysuria Musculoskeletal: acute back pain Derm: acute rash Neuro: acute motor dysfunction Psych: worsening mood Endocrine: polydipsia Heme: bleeding Allergy: hayfever  GEN: nad, alert and oriented HEENT: mucous membranes moist NECK: supple w/o LA CV: rrr. PULM: ctab, no inc wob ABD: soft, +bs EXT:  no edema SKIN: well perfused.

## 2024-04-27 LAB — LIPID PANEL
Cholesterol: 266 mg/dL — ABNORMAL HIGH (ref <200)
HDL: 56 mg/dL (ref >=50)
LDL Cholesterol (Calc): 175 mg/dL — ABNORMAL HIGH
Non-HDL Cholesterol (Calc): 210 mg/dL — ABNORMAL HIGH (ref <130)
Total CHOL/HDL Ratio: 4.8 (calc) (ref <5.0)
Triglycerides: 192 mg/dL — ABNORMAL HIGH (ref <150)

## 2024-04-27 LAB — COMPREHENSIVE METABOLIC PANEL WITH GFR
AG Ratio: 1.7 (calc) (ref 1.0–2.5)
ALT: 21 U/L (ref 6–29)
AST: 19 U/L (ref 10–35)
Albumin: 4.6 g/dL (ref 3.6–5.1)
Alkaline phosphatase (APISO): 75 U/L (ref 37–153)
BUN: 12 mg/dL (ref 7–25)
CO2: 27 mmol/L (ref 20–32)
Calcium: 9.3 mg/dL (ref 8.6–10.4)
Chloride: 104 mmol/L (ref 98–110)
Creat: 0.7 mg/dL (ref 0.50–1.03)
Globulin: 2.7 g/dL (ref 1.9–3.7)
Glucose, Bld: 83 mg/dL (ref 65–99)
Potassium: 4.6 mmol/L (ref 3.5–5.3)
Sodium: 140 mmol/L (ref 135–146)
Total Bilirubin: 0.5 mg/dL (ref 0.2–1.2)
Total Protein: 7.3 g/dL (ref 6.1–8.1)
eGFR: 103 mL/min/1.73m2 (ref >=60)

## 2024-04-28 ENCOUNTER — Ambulatory Visit: Payer: Self-pay | Admitting: Family Medicine

## 2024-04-28 DIAGNOSIS — R0683 Snoring: Secondary | ICD-10-CM | POA: Insufficient documentation

## 2024-04-28 NOTE — Assessment & Plan Note (Signed)
 Recheck labs pending.  Diet and exercise d/w pt.

## 2024-04-28 NOTE — Assessment & Plan Note (Signed)
Husband designated if patient were incapacitated. 

## 2024-04-28 NOTE — Assessment & Plan Note (Signed)
 She has sig snoring, d/w pt.  Referral to pulmonary d/w pt.  [facility] path/phys d/w pt.

## 2024-04-28 NOTE — Assessment & Plan Note (Signed)
 Still using Symbicort  BID, rinsing after use.  Usually no SABA use with symbicort .  D/w pt about smoking cessation. Still on singulair .  Some occ wheeze with high humidity.  Continue with symbicort  with prn SABA.  Continue singulair .

## 2024-04-28 NOTE — Assessment & Plan Note (Signed)
 IUD per gyn clinic.  Mammogram pending 2025 Pap per gyn 2023 Cologuard 2023.  Husband designated if patient were incapacitated.   Lung cancer screening d/w pt.  She didn't want to go through with that yet.  Smoking cessation d/w pt.  She didn't tolerate wellbutrin  but is going to try nicotine gum, d/w pt.   Vaccines d/w pt. Routine vaccination encouraged. [facility] and HCV [facility].   Diet and exercise d/w pt.

## 2024-04-28 NOTE — Assessment & Plan Note (Signed)
 H/o AS with stable echo this year. No CP.  Not SOB.  Not lightheaded.   D/w pt.

## 2024-05-27 ENCOUNTER — Telehealth: Payer: Self-pay | Admitting: Internal Medicine

## 2024-05-27 NOTE — Telephone Encounter (Signed)
 Called to speak with patient to reschedule her 05/31/24 appointment with Dr. Everlyn to be seen for sleep apnea and Dr. Darlean does not treat this.   No answer and voice mail is full---will continue to ty and reach patient

## 2024-05-28 NOTE — Telephone Encounter (Signed)
 Called to discuss rescheduling the Friday 05/31/24 9:00 am appointment with Dr. Darlean.  No anser and voice mail is full.  My Chart message sent requesting patient call our office

## 2024-05-31 ENCOUNTER — Ambulatory Visit: Admitting: Internal Medicine

## 2024-06-11 ENCOUNTER — Ambulatory Visit: Payer: Self-pay

## 2024-06-11 NOTE — Telephone Encounter (Signed)
 FYI Only or Action Required?: Action required by provider: clinical question for provider and medication request.  Patient was last seen in primary care on 04/26/2024 by Cleatus Arlyss RAMAN, MD.  Called Nurse Triage reporting Cough.  Symptoms began a week ago.  Interventions attempted: OTC medications: Advil cold/sinus and Mucinex DM.  Symptoms are: unchanged.  Triage Disposition: See Physician Within 24 Hours  Patient/caregiver understands and will follow disposition?: No, refuses disposition                             Copied from CRM #[id number]. Topic: Clinical - Red Word Triage >> Jun 11, 2024 11:24 AM Robinson H wrote: [facility] that prompted transfer to Nurse Triage: Cough, congestion, coughing up and blowing out green mucus, stomach soreness Reason for Disposition  [1] Continuous (nonstop) coughing interferes with work or school AND [2] no improvement using cough treatment per Care Advice  Answer Assessment - Initial Assessment Questions This RN advised evaluation within 24 hours. No availability in office today. This RN offered availability with alternate provider in office tomorrow morning. Patient declined because she wanted to be seen by PCP today. Patient would like to ask Dr. Cleatus if he can send an antibiotic in for symptoms. Please advise.     1. ONSET: When did the cough begin?      Almost a week 2. SEVERITY: How bad is the cough today?      Frequent coughing spells, especially at night 3. SPUTUM: Describe the color of your sputum (e.g., none, dry cough; clear, white, yellow, green)     Yellow/green 4. HEMOPTYSIS: Are you coughing up any blood? If Yes, ask: How much? (e.g., flecks, streaks, tablespoons, etc.)     Denies 5. DIFFICULTY BREATHING: Are you having difficulty breathing? If Yes, ask: How bad is it? (e.g., mild, moderate, severe)      Denies 6. FEVER: Do you have a fever? If Yes, ask: What is your temperature, how was  it measured, and when did it start?     Denies 7. CARDIAC HISTORY: Do you have any history of heart disease? (e.g., heart attack, congestive heart failure)      Denies 8. LUNG HISTORY: Do you have any history of lung disease?  (e.g., pulmonary embolus, asthma, emphysema)     Family history of lung cancer 9. PE RISK FACTORS: Do you have a history of blood clots? (or: recent major surgery, recent prolonged travel, bedridden)     Denies 10. OTHER SYMPTOMS: Do you have any other symptoms? (e.g., runny nose, wheezing, chest pain)     Abdominal soreness due to cough, ear fullness, denies additional symptoms  11. PREGNANCY: Is there any chance you are pregnant? When was your last menstrual period?     N/A  Protocols used: Cough - Acute Productive-A-AH

## 2024-06-11 NOTE — Telephone Encounter (Signed)
 I would prefer patient to get checked in person, whenever possible.  Please offer OV whenever patient can be seen.  Thanks.

## 2024-06-12 NOTE — Telephone Encounter (Signed)
 Reached out to patient to advise that Dr. Cleatus would prefer her to be seen in office. I did explain that he does not have anything available but I can get her in with another provider. Patient declined an appointment and states that she will tough it out.

## 2024-06-14 ENCOUNTER — Other Ambulatory Visit (HOSPITAL_COMMUNITY): Payer: Self-pay | Admitting: Family Medicine

## 2024-06-14 DIAGNOSIS — Z1231 Encounter for screening mammogram for malignant neoplasm of breast: Secondary | ICD-10-CM

## 2024-06-24 ENCOUNTER — Ambulatory Visit (HOSPITAL_COMMUNITY)
Admission: RE | Admit: 2024-06-24 | Discharge: 2024-06-24 | Disposition: A | Discharge status: Home / Self Care | Source: Ambulatory Visit | Attending: Family Medicine | Admitting: Family Medicine

## 2024-06-24 ENCOUNTER — Encounter (HOSPITAL_COMMUNITY): Payer: Self-pay

## 2024-06-24 DIAGNOSIS — Z1231 Encounter for screening mammogram for malignant neoplasm of breast: Secondary | ICD-10-CM | POA: Insufficient documentation

## 2024-06-25 ENCOUNTER — Ambulatory Visit: Admitting: Internal Medicine

## 2024-06-30 ENCOUNTER — Ambulatory Visit: Payer: Self-pay | Admitting: Family Medicine

## 2024-07-05 NOTE — Telephone Encounter (Signed)
 Copied from CRM [id number]. Topic: Clinical - Lab/Test Results >> Jul 05, 2024 10:47 AM Suzen RAMAN wrote: Reason for CRM: Return call to [place] to discuss mammogram results. Please return call to patient; patient disconnect while waiting on CAL to transfer.  RA#[phone] >> Jul 05, 2024 11:44 AM CMA Curtistine RAMAN wrote: Patient informed.

## 2024-07-28 NOTE — Progress Notes (Signed)
 New Patient Pulmonology Office Visit   Subjective:  Patient ID: Bonnie Murphy, female    DOB: 1971/05/05  MRN: [id number]  Referred by: Cleatus Arlyss RAMAN, MD  CC: No chief complaint on file.   HPI Bonnie Murphy is a 53 y.o. female with hx of asthma who presents for evaluation of sleep disordered breathing.  The Epworth Sleepiness score is ***/24.   {STOPBANG:33649}   {PULM QUESTIONNAIRES (Optional):33196}  ROS  Allergies: Patient has no known allergies.  Current Outpatient Medications:    albuterol  (VENTOLIN  HFA) 108 (90 Base) MCG/ACT inhaler, Inhale 1-2 puffs into the lungs every 6 (six) hours as needed for wheezing or shortness of breath., Disp: 8 g, Rfl: 3   budesonide -formoterol  (SYMBICORT ) 80-4.5 MCG/ACT inhaler, Inhale 2 puffs into the lungs 2 (two) times daily. Rinse after using., Disp: 3 each, Rfl: 3   ibuprofen (ADVIL) 200 MG tablet, Take 800 mg by mouth 2 (two) times daily as needed., Disp: , Rfl:    levonorgestrel  (LILETTA , 52 MG,) 19.5 MCG/DAY IUD IUD, 1 each by Intrauterine route once., Disp: , Rfl:    montelukast  (SINGULAIR ) 10 MG tablet, Take 1 tablet (10 mg total) by mouth at bedtime., Disp: 90 tablet, Rfl: 3 Past Medical History:  Diagnosis Date   Asthma    seasonal   Cyst of right breast    IUD (intrauterine device) in place 11/21/2014   OAB (overactive bladder) 11/21/2014   Wheezing 11/15/2013   Past Surgical History:  Procedure Laterality Date   CESAREAN SECTION     Family History  Problem Relation Age of Onset   Diabetes Mother    Lung cancer Mother    Cirrhosis Father    Lung cancer Father    Lung cancer Paternal Aunt    Lung cancer Paternal Uncle    Cirrhosis Paternal Grandmother    Colon cancer Neg Hx    Breast cancer Neg Hx    Social History   Socioeconomic History   Marital status: Married    Spouse name: Not on file   Number of children: Not on file   Years of education: Not on file   Highest education level: Not on file   Occupational History   Not on file  Tobacco Use   Smoking status: Every Day    Current packs/day: 1.00    Average packs/day: 1 pack/day for 31.0 years (31.0 ttl pk-yrs)    Types: Cigarettes   Smokeless tobacco: Never  Vaping Use   Vaping status: Never Used  Substance and Sexual Activity   Alcohol use: Not Currently    Comment: rarely   Drug use: No   Sexual activity: Yes    Partners: Male    Birth control/protection: I.U.D.  Other Topics Concern   Not on file  Social History Narrative   Working at [employer]   Lives with husband and 1 son   Enjoys outside activities, Copy.     Social Drivers of Corporate Investment Banker Strain: Low Risk  (06/08/2022)   Overall Financial Resource Strain (CARDIA)    Difficulty of Paying Living Expenses: Not hard at all  Food Insecurity: No Food Insecurity (06/08/2022)   Hunger Vital Sign    Worried About Running Out of Food in the Last Year: Never true    Ran Out of Food in the Last Year: Never true  Transportation Needs: No Transportation Needs (06/08/2022)   PRAPARE - Transportation  Lack of Transportation (Medical): No    Lack of Transportation (Non-Medical): No  Physical Activity: Inactive (06/08/2022)   Exercise Vital Sign    Days of Exercise per Week: 7 days    Minutes of Exercise per Session: 0 min  Stress: No Stress Concern Present (06/08/2022)   [employer] of Occupational Health - Occupational Stress Questionnaire    Feeling of Stress : Not at all  Social Connections: Moderately Integrated (06/08/2022)   Social Connection and Isolation Panel    Frequency of Communication with Friends and Family: More than three times a week    Frequency of Social Gatherings with Friends and Family: Once a week    Attends Religious Services: More than 4 times per year    Active Member of [employer] or Organizations: No    Attends Banker Meetings: Never    Marital Status: Married  Careers Information Officer Violence: Not At Risk (06/08/2022)   Humiliation, Afraid, Rape, and Kick questionnaire    Fear of Current or Ex-Partner: No    Emotionally Abused: No    Physically Abused: No    Sexually Abused: No       Objective:  There were no vitals taken for this visit. {Pulm Vitals (Optional):32837}  Physical Exam  Diagnostic Review:  {Labs (Optional):32838}     Assessment & Plan:   Assessment & Plan   No orders of the defined types were placed in this encounter.     No follow-ups on file.   Trudy Kory, MD

## 2024-07-29 ENCOUNTER — Ambulatory Visit (INDEPENDENT_AMBULATORY_CARE_PROVIDER_SITE_OTHER): Admitting: Pulmonary Disease

## 2024-07-29 ENCOUNTER — Encounter: Payer: Self-pay | Admitting: Pulmonary Disease

## 2024-07-29 ENCOUNTER — Ambulatory Visit (HOSPITAL_COMMUNITY)
Admission: RE | Admit: 2024-07-29 | Discharge: 2024-07-29 | Disposition: A | Discharge status: Home / Self Care | Source: Ambulatory Visit | Attending: Pulmonary Disease | Admitting: Pulmonary Disease

## 2024-07-29 VITALS — BP 153/95 | HR 72 | Ht 63.0 in | Wt 186.8 lb

## 2024-07-29 DIAGNOSIS — J4540 Moderate persistent asthma, uncomplicated: Secondary | ICD-10-CM

## 2024-07-29 DIAGNOSIS — R0683 Snoring: Secondary | ICD-10-CM | POA: Diagnosis not present

## 2024-07-29 DIAGNOSIS — R062 Wheezing: Secondary | ICD-10-CM

## 2024-07-29 DIAGNOSIS — F17210 Nicotine dependence, cigarettes, uncomplicated: Secondary | ICD-10-CM | POA: Diagnosis not present

## 2024-07-29 DIAGNOSIS — G4730 Sleep apnea, unspecified: Secondary | ICD-10-CM

## 2024-07-29 MED ORDER — NICOTINE 14 MG/24HR TD PT24: MEDICATED_PATCH | TRANSDERMAL | 0 refills | Status: AC

## 2024-07-29 MED ORDER — BREZTRI AEROSPHERE 160-9-4.8 MCG/ACT IN AERO: 2.0000 | INHALATION_SPRAY | Freq: Two times a day (BID) | RESPIRATORY_TRACT | Status: AC

## 2024-07-29 MED ORDER — NICOTINE 21 MG/24HR TD PT24: MEDICATED_PATCH | TRANSDERMAL | 0 refills | Status: AC

## 2024-07-29 MED ORDER — NICOTINE 7 MG/24HR TD PT24: MEDICATED_PATCH | TRANSDERMAL | 0 refills | Status: AC

## 2024-07-29 NOTE — Patient Instructions (Signed)
  VISIT SUMMARY: Today, we discussed your snoring and possible sleep apnea, persistent asthma, and smoking habits. We have planned some tests and provided new treatment options to help manage your symptoms and improve your overall health.  YOUR PLAN: SNORING AND POSSIBLE SLEEP APNEA: You have been experiencing increased snoring, and we need to check if you have sleep apnea. -We ordered a home sleep test to evaluate for sleep apnea. -If sleep apnea is confirmed, we will discuss the potential use of a CPAP machine.  PERSISTENT ASTHMA WITH RIGHT-SIDED WHEEZING: Your asthma symptoms are persistent, especially with right-sided wheezing, and are triggered by cold, smells, and allergens. -We provided a sample of Breztri  inhaler for you to try. -We ordered a chest x-ray to evaluate the right-sided wheezing. -Continue using your current asthma medications, Symbicort  and Singulair .  TOBACCO USE DISORDER: You have been smoking for a long time and have had difficulty quitting in the past. -We discussed lung cancer screening and encouraged you to consider it. -We provided information on smoking cessation aids, including nicotine  gum and patches. -We offered a prescription for nicotine  patches if you want to use them. -We encouraged you to gradually reduce smoking using a combination of gum and patches.  Contains text generated by [employer].

## 2024-07-31 ENCOUNTER — Ambulatory Visit

## 2024-08-03 ENCOUNTER — Ambulatory Visit: Payer: Self-pay | Admitting: Pulmonary Disease

## 2024-08-03 DIAGNOSIS — J4540 Moderate persistent asthma, uncomplicated: Secondary | ICD-10-CM

## 2024-08-06 ENCOUNTER — Ambulatory Visit

## 2024-08-06 DIAGNOSIS — L578 Other skin changes due to chronic exposure to nonionizing radiation: Secondary | ICD-10-CM | POA: Diagnosis not present

## 2024-08-06 DIAGNOSIS — L814 Other melanin hyperpigmentation: Secondary | ICD-10-CM

## 2024-08-06 DIAGNOSIS — L821 Other seborrheic keratosis: Secondary | ICD-10-CM | POA: Diagnosis not present

## 2024-08-06 DIAGNOSIS — D485 Neoplasm of uncertain behavior of skin: Secondary | ICD-10-CM | POA: Diagnosis not present

## 2024-08-06 DIAGNOSIS — W908XXA Exposure to other nonionizing radiation, initial encounter: Secondary | ICD-10-CM

## 2024-08-06 NOTE — Progress Notes (Signed)
    Subjective   Bonnie Murphy is a 53 y.o. female who presents for the following: Lesion(s) of concern . Patient is new patient  Today patient reports: Spot on the nose and L cheek for over a year. Pt would like to discuss tx options. Was told by another dermatologist to use compound W and lesion did go away, but then grew back on the L cheek  Review of Systems:    No other skin or systemic complaints except as noted in HPI or Assessment and Plan.  The following portions of the chart were reviewed this encounter and updated as appropriate: medications, allergies, medical history  Relevant Medical History:  n/a   Objective  (SKPE) Well appearing patient in no apparent distress; mood and affect are within normal limits. Examination was performed of the: Focused Exam of: the face   Examination notable for: Lentigo/lentigines: Scattered pigmented macules that are tan to brown in color and are somewhat non-uniform in shape and concentrated in the sun-exposed areas, Seborrheic Keratosis(es): Stuck-on appearing keratotic papule(s) on the trunk, some  irritated with redness, crusting, edema, and/or partial avulsion, Actinic Damage/Elastosis: chronic sun damage: dyspigmentation, telangiectasia, and wrinkling, Actinic keratosis: Scaly erythematous macule(s) concentrated on sun exposed areas   Examination limited by: n/a   L nasal bridge 0.9 cm tan pink atrophic plaque   Assessment & Plan  (SKAP)   BENIGN SKIN FINDINGS  - Lentigines  - Seborrheic keratoses - Reassurance provided regarding the benign appearance of lesions noted on exam today; no treatment is indicated in the absence of symptoms/changes. - Reinforced importance of photoprotective strategies including liberal and frequent sunscreen use of a broad-spectrum SPF 30 or greater, use of protective clothing, and sun avoidance for prevention of cutaneous malignancy and photoaging.  Counseled patient on the importance of regular self-skin  monitoring as well as routine clinical skin examinations as scheduled.   ACTINIC DAMAGE - Chronic condition, secondary to cumulative UV/sun exposure - Recommend daily broad spectrum sunscreen SPF 30+ to sun-exposed areas, reapply every 2 hours as needed.  - Staying in the shade or wearing long sleeves, sun glasses (UVA+UVB protection) and wide brim hats (4-inch brim around the entire circumference of the hat) are also recommended for sun protection.  - Call for new or changing lesions.  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  Neoplasm of uncertain behavior of skin    Return to clinic: Return for bx of the nose in one week.  LILLETTE Rosina Mayans, CMA, am acting as scribe for Lauraine JAYSON Kanaris, MD .   Documentation: I have reviewed the above documentation for accuracy and completeness, and I agree with the above.  Lauraine JAYSON Kanaris, MD

## 2024-08-06 NOTE — Patient Instructions (Addendum)

## 2024-08-09 MED ORDER — BREZTRI AEROSPHERE 160-9-4.8 MCG/ACT IN AERO: 2.0000 | INHALATION_SPRAY | Freq: Two times a day (BID) | RESPIRATORY_TRACT | 6 refills | Status: AC

## 2024-08-09 NOTE — Telephone Encounter (Signed)
 Copied from CRM [id number]. Topic: Clinical - Medication Question >> Aug 06, 2024 11:51 AM Isabell A wrote: Reason for CRM: Patient received sample of Breztri  and she does like it, would like a prescription to:  Pharmacy  [facility] [address] [place] - [address], [place] [address] Phone: [phone]  Fax: [phone]   Please advise

## 2024-08-09 NOTE — Progress Notes (Signed)
 Order submitted

## 2024-08-15 ENCOUNTER — Ambulatory Visit

## 2024-11-11 ENCOUNTER — Ambulatory Visit: Admitting: Pulmonary Disease
